# Patient Record
Sex: Male | Born: 1965 | Race: White | Hispanic: No | Marital: Married | Smoking: Never smoker
Health system: Southern US, Community
[De-identification: ages and names within clinical notes are randomized; demographics above are authoritative.]

## PROBLEM LIST (undated history)

## (undated) DIAGNOSIS — E785 Hyperlipidemia, unspecified: Secondary | ICD-10-CM

## (undated) DIAGNOSIS — R569 Unspecified convulsions: Secondary | ICD-10-CM

## (undated) DIAGNOSIS — G4730 Sleep apnea, unspecified: Secondary | ICD-10-CM

## (undated) DIAGNOSIS — D869 Sarcoidosis, unspecified: Secondary | ICD-10-CM

## (undated) DIAGNOSIS — I10 Essential (primary) hypertension: Secondary | ICD-10-CM

## (undated) DIAGNOSIS — F4310 Post-traumatic stress disorder, unspecified: Secondary | ICD-10-CM

## (undated) DIAGNOSIS — E119 Type 2 diabetes mellitus without complications: Secondary | ICD-10-CM

## (undated) DIAGNOSIS — F419 Anxiety disorder, unspecified: Secondary | ICD-10-CM

## (undated) DIAGNOSIS — N529 Male erectile dysfunction, unspecified: Secondary | ICD-10-CM

## (undated) DIAGNOSIS — J82 Pulmonary eosinophilia, not elsewhere classified: Secondary | ICD-10-CM

## (undated) DIAGNOSIS — F32A Depression, unspecified: Secondary | ICD-10-CM

## (undated) DIAGNOSIS — S83232A Complex tear of medial meniscus, current injury, left knee, initial encounter: Secondary | ICD-10-CM

## (undated) DIAGNOSIS — F329 Major depressive disorder, single episode, unspecified: Secondary | ICD-10-CM

## (undated) HISTORY — DX: Hyperlipidemia, unspecified: E78.5

## (undated) HISTORY — PX: KNEE SURGERY: SHX244

## (undated) HISTORY — DX: Unspecified convulsions: R56.9

## (undated) HISTORY — DX: Essential (primary) hypertension: I10

## (undated) HISTORY — PX: ELBOW SURGERY: SHX618

## (undated) HISTORY — DX: Pulmonary eosinophilia, not elsewhere classified: J82

---

## 1999-04-02 ENCOUNTER — Observation Stay (HOSPITAL_COMMUNITY): Admission: RE | Admit: 1999-04-02 | Discharge: 1999-04-03 | Payer: Self-pay | Admitting: Orthopedic Surgery

## 1999-12-25 ENCOUNTER — Inpatient Hospital Stay (HOSPITAL_COMMUNITY): Admission: RE | Admit: 1999-12-25 | Discharge: 1999-12-26 | Payer: Self-pay | Admitting: Orthopedic Surgery

## 2001-11-04 ENCOUNTER — Emergency Department (HOSPITAL_COMMUNITY): Admission: EM | Admit: 2001-11-04 | Discharge: 2001-11-04 | Payer: Self-pay | Admitting: Emergency Medicine

## 2002-07-19 ENCOUNTER — Encounter: Payer: Self-pay | Admitting: Orthopedic Surgery

## 2002-07-19 ENCOUNTER — Ambulatory Visit (HOSPITAL_COMMUNITY): Admission: RE | Admit: 2002-07-19 | Discharge: 2002-07-19 | Payer: Self-pay | Admitting: Orthopedic Surgery

## 2002-08-20 ENCOUNTER — Ambulatory Visit (HOSPITAL_BASED_OUTPATIENT_CLINIC_OR_DEPARTMENT_OTHER): Admission: RE | Admit: 2002-08-20 | Discharge: 2002-08-20 | Payer: Self-pay | Admitting: Orthopedic Surgery

## 2003-04-26 HISTORY — PX: COLON SURGERY: SHX602

## 2004-03-10 ENCOUNTER — Ambulatory Visit (HOSPITAL_COMMUNITY): Admission: RE | Admit: 2004-03-10 | Discharge: 2004-03-10 | Payer: Self-pay | Admitting: Family Medicine

## 2004-06-27 ENCOUNTER — Ambulatory Visit (HOSPITAL_BASED_OUTPATIENT_CLINIC_OR_DEPARTMENT_OTHER): Admission: RE | Admit: 2004-06-27 | Discharge: 2004-06-27 | Payer: Self-pay | Admitting: Family Medicine

## 2004-07-04 ENCOUNTER — Ambulatory Visit: Payer: Self-pay | Admitting: Internal Medicine

## 2004-08-23 ENCOUNTER — Encounter: Admission: RE | Admit: 2004-08-23 | Discharge: 2004-08-23 | Payer: Self-pay | Admitting: Dermatology

## 2004-08-27 ENCOUNTER — Ambulatory Visit (HOSPITAL_COMMUNITY): Admission: RE | Admit: 2004-08-27 | Discharge: 2004-08-27 | Payer: Self-pay | Admitting: Family Medicine

## 2004-12-22 ENCOUNTER — Ambulatory Visit (HOSPITAL_COMMUNITY): Admission: RE | Admit: 2004-12-22 | Discharge: 2004-12-22 | Payer: Self-pay | Admitting: Orthopedic Surgery

## 2005-04-22 ENCOUNTER — Encounter (INDEPENDENT_AMBULATORY_CARE_PROVIDER_SITE_OTHER): Payer: Self-pay | Admitting: *Deleted

## 2005-04-22 ENCOUNTER — Observation Stay (HOSPITAL_COMMUNITY): Admission: RE | Admit: 2005-04-22 | Discharge: 2005-04-23 | Payer: Self-pay | Admitting: Otolaryngology

## 2005-06-06 ENCOUNTER — Ambulatory Visit (HOSPITAL_COMMUNITY): Admission: RE | Admit: 2005-06-06 | Discharge: 2005-06-06 | Payer: Self-pay | Admitting: Neurological Surgery

## 2006-03-10 ENCOUNTER — Ambulatory Visit (HOSPITAL_BASED_OUTPATIENT_CLINIC_OR_DEPARTMENT_OTHER): Admission: RE | Admit: 2006-03-10 | Discharge: 2006-03-10 | Payer: Self-pay | Admitting: Urology

## 2006-05-22 ENCOUNTER — Encounter: Payer: Self-pay | Admitting: Vascular Surgery

## 2006-05-22 ENCOUNTER — Inpatient Hospital Stay (HOSPITAL_COMMUNITY): Admission: EM | Admit: 2006-05-22 | Discharge: 2006-05-27 | Payer: Self-pay | Admitting: Emergency Medicine

## 2006-06-12 ENCOUNTER — Ambulatory Visit: Payer: Self-pay | Admitting: Internal Medicine

## 2006-07-25 ENCOUNTER — Ambulatory Visit: Payer: Self-pay | Admitting: Internal Medicine

## 2006-11-14 ENCOUNTER — Ambulatory Visit: Payer: Self-pay | Admitting: Internal Medicine

## 2007-01-03 ENCOUNTER — Ambulatory Visit: Payer: Self-pay | Admitting: Internal Medicine

## 2007-01-03 LAB — CONVERTED CEMR LAB
ALT: 72 units/L — ABNORMAL HIGH (ref 0–53)
AST: 40 units/L — ABNORMAL HIGH (ref 0–37)
Albumin: 4.5 g/dL (ref 3.5–5.2)
Alkaline Phosphatase: 62 units/L (ref 39–117)
BUN: 9 mg/dL (ref 6–23)
CO2: 29 meq/L (ref 19–32)
Calcium: 9.3 mg/dL (ref 8.4–10.5)
Chloride: 102 meq/L (ref 96–112)
Creatinine, Ser: 1 mg/dL (ref 0.4–1.5)
GFR calc Af Amer: 105 mL/min
GFR calc non Af Amer: 87 mL/min
Glucose, Bld: 129 mg/dL — ABNORMAL HIGH (ref 70–99)
Potassium: 3.6 meq/L (ref 3.5–5.1)
Sed Rate: 6 mm/hr (ref 0–20)
Sodium: 138 meq/L (ref 135–145)
Total Bilirubin: 0.8 mg/dL (ref 0.3–1.2)
Total Protein: 6.8 g/dL (ref 6.0–8.3)

## 2007-03-07 DIAGNOSIS — D869 Sarcoidosis, unspecified: Secondary | ICD-10-CM

## 2007-03-07 DIAGNOSIS — M129 Arthropathy, unspecified: Secondary | ICD-10-CM | POA: Insufficient documentation

## 2007-03-07 DIAGNOSIS — L52 Erythema nodosum: Secondary | ICD-10-CM

## 2007-03-08 ENCOUNTER — Ambulatory Visit: Payer: Self-pay | Admitting: Internal Medicine

## 2007-05-25 ENCOUNTER — Ambulatory Visit (HOSPITAL_COMMUNITY): Admission: RE | Admit: 2007-05-25 | Discharge: 2007-05-25 | Payer: Self-pay | Admitting: Orthopedic Surgery

## 2007-06-08 ENCOUNTER — Encounter: Admission: RE | Admit: 2007-06-08 | Discharge: 2007-06-08 | Payer: Self-pay | Admitting: Orthopedic Surgery

## 2010-05-15 ENCOUNTER — Encounter: Payer: Self-pay | Admitting: Neurological Surgery

## 2010-05-15 ENCOUNTER — Encounter: Payer: Self-pay | Admitting: Family Medicine

## 2010-05-16 ENCOUNTER — Encounter: Payer: Self-pay | Admitting: Specialist

## 2010-05-17 ENCOUNTER — Encounter: Payer: Self-pay | Admitting: Family Medicine

## 2010-07-20 DIAGNOSIS — E785 Hyperlipidemia, unspecified: Secondary | ICD-10-CM

## 2010-07-20 DIAGNOSIS — F411 Generalized anxiety disorder: Secondary | ICD-10-CM

## 2010-07-20 DIAGNOSIS — E291 Testicular hypofunction: Secondary | ICD-10-CM

## 2010-07-20 DIAGNOSIS — I10 Essential (primary) hypertension: Secondary | ICD-10-CM

## 2010-08-31 ENCOUNTER — Ambulatory Visit (INDEPENDENT_AMBULATORY_CARE_PROVIDER_SITE_OTHER)
Admission: RE | Admit: 2010-08-31 | Discharge: 2010-08-31 | Disposition: A | Discharge status: Home / Self Care | Payer: BC Managed Care – PPO | Source: Ambulatory Visit | Attending: Internal Medicine | Admitting: Internal Medicine

## 2010-08-31 ENCOUNTER — Other Ambulatory Visit (INDEPENDENT_AMBULATORY_CARE_PROVIDER_SITE_OTHER): Payer: BC Managed Care – PPO

## 2010-08-31 ENCOUNTER — Encounter: Payer: Self-pay | Admitting: Internal Medicine

## 2010-08-31 ENCOUNTER — Ambulatory Visit (INDEPENDENT_AMBULATORY_CARE_PROVIDER_SITE_OTHER): Payer: BC Managed Care – PPO | Admitting: Internal Medicine

## 2010-08-31 VITALS — BP 120/90 | HR 72 | Temp 97.5°F | Ht 69.0 in | Wt 209.0 lb

## 2010-08-31 DIAGNOSIS — D869 Sarcoidosis, unspecified: Secondary | ICD-10-CM

## 2010-08-31 LAB — BASIC METABOLIC PANEL WITH GFR
BUN: 14 mg/dL (ref 6–23)
CO2: 29 meq/L (ref 19–32)
Calcium: 9.1 mg/dL (ref 8.4–10.5)
Chloride: 104 meq/L (ref 96–112)
Creatinine, Ser: 0.7 mg/dL (ref 0.4–1.5)
GFR: 124.57 mL/min
Glucose, Bld: 111 mg/dL — ABNORMAL HIGH (ref 70–99)
Potassium: 4.1 meq/L (ref 3.5–5.1)
Sodium: 140 meq/L (ref 135–145)

## 2010-08-31 LAB — CBC WITH DIFFERENTIAL/PLATELET
Basophils Absolute: 0 K/uL (ref 0.0–0.1)
Basophils Relative: 0.8 % (ref 0.0–3.0)
Eosinophils Absolute: 0.1 K/uL (ref 0.0–0.7)
Eosinophils Relative: 2.3 % (ref 0.0–5.0)
HCT: 45.3 % (ref 39.0–52.0)
Hemoglobin: 16.3 g/dL (ref 13.0–17.0)
Lymphocytes Relative: 21.5 % (ref 12.0–46.0)
Lymphs Abs: 1.2 K/uL (ref 0.7–4.0)
MCHC: 36 g/dL (ref 30.0–36.0)
MCV: 87.6 fl (ref 78.0–100.0)
Monocytes Absolute: 0.6 K/uL (ref 0.1–1.0)
Monocytes Relative: 10.8 % (ref 3.0–12.0)
Neutro Abs: 3.7 K/uL (ref 1.4–7.7)
Neutrophils Relative %: 64.6 % (ref 43.0–77.0)
Platelets: 162 K/uL (ref 150.0–400.0)
RBC: 5.17 Mil/uL (ref 4.22–5.81)
RDW: 14.5 % (ref 11.5–14.6)
WBC: 5.8 K/uL (ref 4.5–10.5)

## 2010-08-31 LAB — HEPATIC FUNCTION PANEL
ALT: 37 U/L (ref 0–53)
AST: 23 U/L (ref 0–37)
Albumin: 4.3 g/dL (ref 3.5–5.2)
Alkaline Phosphatase: 62 U/L (ref 39–117)
Bilirubin, Direct: 0.1 mg/dL (ref 0.0–0.3)
Total Bilirubin: 1.3 mg/dL — ABNORMAL HIGH (ref 0.3–1.2)
Total Protein: 6.9 g/dL (ref 6.0–8.3)

## 2010-08-31 LAB — SEDIMENTATION RATE: Sed Rate: 4 mm/h (ref 0–22)

## 2010-08-31 MED ORDER — HYDROXYCHLOROQUINE SULFATE 200 MG PO TABS: ORAL_TABLET | ORAL | Status: DC

## 2010-08-31 MED ORDER — PREDNISONE (PAK) 10 MG PO TABS: ORAL_TABLET | ORAL | Status: AC

## 2010-08-31 NOTE — Patient Instructions (Signed)
We will call you with xray and lab results  Take prednisone 10 mg 2 daily, then one daily x 2 weeks, then one half day  Plaquenil 200 mg one daily stop for nausea.  Please schedule a follow up office visit in 4 weeks, sooner if needed

## 2010-08-31 NOTE — Progress Notes (Signed)
  Subjective:    Patient ID: Tony Randolph, male    DOB: 03-18-1966, 45 y.o.   MRN: [id number]  HPI  85 yowm never smoker with acute onset arthritis, EN, hilar adenopathy na d pos skin bx at [facility] c/w sarcoid in 2007. Rx prednisone and complete resolution and off Prednisone as of July 2008 and fine until 05/2010 while [place] recurrent rash and joint pain and dry cough back on prednisone > complete resolution while on it but only for 2 weeks x 2.  08/31/2010 Initial pulmonary office eval in EMR era cc arthritis feet = in both heals, R hip not L hip or or knees better on naprosyn but concerned re easy bleeding from it.  New rash on face and in areas of tatoos "just like before".    Pt denies any significant sore throat, dysphagia, itching, sneezing,  nasal congestion or excess/ purulent secretions,  fever, chills, sweats, unintended wt loss, pleuritic or exertional cp, hempoptysis, orthopnea pnd or leg swelling.    Also denies any obvious fluctuation of symptoms with weather or environmental changes or other aggravating or alleviating factors.  No ocular or neuro complaints.  Minimal dry cough , no sob.  Review of Systems  Constitutional: Negative for fever, chills, activity change, appetite change and unexpected weight change.  HENT: Negative for congestion, sore throat, rhinorrhea, sneezing, trouble swallowing, dental problem, voice change and postnasal drip.   Eyes: Negative for visual disturbance.  Respiratory: Positive for cough. Negative for choking and shortness of breath.   Cardiovascular: Negative for chest pain and leg swelling.  Gastrointestinal: Negative for nausea, vomiting and abdominal pain.  Genitourinary: Negative for difficulty urinating.  Musculoskeletal: Positive for arthralgias.  Skin: Positive for rash.  Psychiatric/Behavioral: Negative for behavioral problems and confusion.       Objective:   Physical Exam    amb pleasant wm nad    Wt 209 08/31/10 HEENT: nl  dentition, turbinates, and orophanx. Nl external ear canals without cough reflex. Classic puruplish papular lesions x 3 R cheek.     NECK :  without JVD/Nodes/TM/ nl carotid upstrokes bilaterally   LUNGS: no acc muscle use, clear to A and P bilaterally without cough on insp or exp maneuvers   CV:  RRR  no s3 or murmur or increase in P2, no edema   ABD:  soft and nontender with nl excursion in the supine position. No bruits or organomegaly, bowel sounds nl  MS:  warm without deformities, calf tenderness, cyanosis or clubbing  SKIN: warm and dry without lesions . No viz lesions over ext/ no EN  NEURO:  alert, approp, no deficits     cxr 08/31/10 > Stable central peribronchial cuffing and unchanged mild right  basilar volume loss. No new focal or acute cardiopulmonary  abnormality.  Assessment & Plan:

## 2010-09-01 NOTE — Assessment & Plan Note (Signed)
Recurrent typical rash, somewhat atypical arthritis and no sign pulmonary involvement x for minimal dry cough with no def change on cxr.  Very unusual pattern but this is still most likely all sarcoid and should respond to plaquenil and minimal steroids this time.  The goal with a chronic steroid dependent illness is always arriving at the lowest effective dose that controls the disease/symptoms and not accepting a set "formula" which is based on statistics or guidelines that don't always take into account patient  variability or the natural hx of the dz in every individual patient, which may well vary over time.  For now therefore I recommend the patient maintain  For now set ceiling at 20 and floor at 5 mg per day  Discussed in detail all the  indications, usual  risks and alternatives  relative to the benefits with patient who agrees to proceed with rx as outlined.  See instructions for specific recommendations which were reviewed directly with the patient who was given a copy with highlighter outlining the key components.

## 2010-09-01 NOTE — Progress Notes (Signed)
Quick Note:  Spoke with pt and notified of results per Dr. Wert. Pt verbalized understanding and denied any questions.,  ______ 

## 2010-09-01 NOTE — Progress Notes (Signed)
Quick Note:  Spoke with pt and notified of results per Dr. Wert. Pt verbalized understanding and denied any questions.  ______ 

## 2010-09-07 NOTE — Assessment & Plan Note (Signed)
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  He was supposed to return in mid May for a  followup but did not do this.  He complains of mild weight gain (about  10 pounds), and borderline sugar per his nurse practitioner at [facility].  He denies any dyspnea or cough.   PHYSICAL EXAMINATION:  This is a pleasant, ambulatory, moderately obese  white male who has gained, according to my notes, only 3 pounds above  baseline.  He is afebrile with normal vital signs.  HEENT:  Unremarkable.  Oropharynx is clear.  Lung fields are clear bilaterally to auscultation and percussion.  HEART:  Regular rhythm without murmurs, gallops, or rubs.  ABDOMEN:  Soft, benign.  EXTREMITIES:  Warm without calf tenderness, cyanosis, clubbing, or  edema.  There is no evidence of cervical adenopathy.   Chest x-rays pending.   IMPRESSION:  No evidence of active sarcoid, nor any adverse effect from  prednisone including significant weight gain.   RECOMMENDATIONS:  Taper prednisone to 10 mg alternating with 5 mg per  day for 2 weeks, and then 5 mg per day for 2 weeks, and then 5 mg  alternating with zero for 2 weeks, and then return here for followup to  see if the prednisone can be stopped.   If he flares in terms of arthritis and it can be controlled with up to 3  or 4 Advil with meals, I would like him to use the Advil.  Otherwise he  needs to go up to the higher dose of prednisone, or if he flares with  erythema nodosum symptoms as he had before, I would like him to go to  the previous dose that worked, using the concept that the lowest dose  that controls the symptoms is the best dose.   I spent an extra 15-20 minutes at a 25 minute visit going over this  issue and the issue of adverse effects from prednisone with him,  including the importance of being very careful with diet, that he not  gain weight or take in food that is excessive sugars or starches.   I also emphasized that it is important to follow up at 6 weeks so we can  taper him completely off the prednisone if possible.   Chest x-rays pending at the time of this dictation and will be checked.     [name]. Sherene Sires, MD, [facility]  Electronically Signed    MBW/MedQ  DD: 11/14/2006  DT: 11/15/2006  Job #: [id number]

## 2010-09-07 NOTE — Assessment & Plan Note (Signed)
[facility]                             PULMONARY OFFICE NOTE   Tony Randolph, Tony Randolph                     MRN:          [id number]  DATE:01/03/2007                            DOB:          04-11-1966    This is a pulmonary extended followup office visit.   HISTORY:  A 45 year old white male with abrupt onset arthritis, erythema  nodosum and hilar adenopathy in December of 2007 with dramatic reversal  of symptoms with prednisone and elimination of prednisone in February of  2008.  He has had no significant flareup of any of his symptoms except  for mild arthritis for which he has found Advil not particularly  effective.  He says he is much better than he was.  He denies any ocular  or other new articular complaints, rash, cough, dyspnea, fevers, chills,  sweats or weight loss.   PHYSICAL EXAMINATION:  He is a pleasant ambulatory white man in no acute  distress.  He is afebrile with stable vital signs.  HEENT:  Is unremarkable.  Oropharynx clear.  LUNGS:  Lung fields clear bilaterally.  Clear to auscultation and  percussion  HEART:  Regular rate and rhythm.  Without murmur.  ABDOMEN:  Soft, benign.  EXTREMITIES:  Warm without calf tenderness, cyanosis, clubbing, edema.   Chest x-ray reviewed from July 22 and shows no significant evidence of  active sarcoid.   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  I have recommended a  trial of Aleve for this purpose.   I did recommend also a restaging of his systemic sarcoid with a  chemistry profile that will give [place] LFTs and calcium level and an ACE  level and sed rate to indicate any evidence of active systemic  inflammation or sarcoid.   I did also recommend Pneumovax therapy, which he  agreed to today.  Follow up will be in 3 months or sooner if needed with a recommendation  that he try Aleve in the meantime on an as needed basis.     [name]. Sherene Sires, MD, [facility]  Electronically Signed    MBW/MedQ  DD: 01/03/2007  DT: 01/04/2007  Job #: [id number]   cc:   Attention:  Ms Long, Nurse Practitioner [facility]

## 2010-09-07 NOTE — Assessment & Plan Note (Signed)
[facility]                             PULMONARY OFFICE NOTE   DUQUAN, GILLOOLY                     MRN:          [id number]  DATE:03/08/2007                            DOB:          1965/06/13    HISTORY:  This is a 45 year old white male with a classic, abrupt onset  of arthritis, erythema nodosum, hilar adenopathy with positive skin  biopsy for sarcoid in December of 2007.  He dramatically eliminated all  of his symptoms on prednisone acutely in February of 2008 and did not  require long-term therapy.  He did notice, however, significant myalgias  and arthralgias that occurred after prednisone was tapered (with no  other significant symptoms that could be attributed to sarcoid).  These  could all be controlled easily with the use of nonsteroidals except for  persistent left hip pain.  He says, in retrospect, he thinks he may have  injured his hip years ago and has never been right since.  His point  is that the prednisone totally eliminated what had been a chronic  problem with the left hip, but now the left hip is still hurting and  does not really respond well to nonsteroidals.   He denies any ocular or other articular complaints, fevers, chills,  sweats, orthopnea, PND or leg swelling or new rash.   PHYSICAL EXAMINATION:  He is a pleasant, ambulatory, moderately obese  white male in no acute distress weight 230 pounds, down 10 pounds from  his previous visit in September of this year.  HEENT:  Unremarkable.  Oropharynx is clear.  NECK:  Supple without cervical adenopathy or tenderness.  Trachea is  midline, no thyromegaly.  Lung fields are completely clear bilaterally to auscultation and  percussion.  HEART:  Regular rhythm without murmurs, gallops, or rubs.  ABDOMEN:  Soft, benign.  EXTREMITIES:  Warm without calf tenderness, cyanosis, clubbing, or  edema.  There was no evidence of any skin change consistent with sarcoid or  erythema nodosum for that matter.  Left hip revealed minimal reduction in internal and external rotation,  no radicular features elicited.   Most recent lab data done off of prednisone in September showed a sed  rate of only 6 with no elevation of total protein relative to albumin or  hypercalcemia or significant elevation of liver function tests.   IMPRESSION:  1. Classic Loeffler's syndrome (arthritis, erythema nodosum, and      adenopathy with only minimal residual arthritic complaints that are      well controlled with nonsteroidals, off of prednisone now since      July of this year with no evidence of definite flare up).  I did recommend a followup x-ray here in 3 months, but no further  systemic therapy other than nonsteroidals.  1. I am concerned about his left hip, but do not believe this is      sarcoid related, especially since, in retrospect, he had chronic      pain in the hip before he was ever started on prednisone.  The      response to prednisone, I  informed him, is nonspecific but      encouraging, and it may well be that just an injection of cortisone      might take care of the problem (such as might occur with bursitis).      I am, therefore, going to refer him to Dr. Chaney Malling to evaluate      his left hip, but recommended he continue on nonsteroidals only on      a p.r.n. basis.  Certainly if he has more convincing evidence of      systemic arthritis off of prednisone I would like to see him back      sooner.   I spent extra time going over the natural history of sarcoid with him,  emphasizing what is most likely and not likely to be sarcoid in his  particular case.     [name]. Sherene Sires, MD, [facility]  Electronically Signed    MBW/MedQ  DD: 03/08/2007  DT: 03/09/2007  Job #: [id number]

## 2010-09-10 NOTE — Procedures (Signed)
Tony Randolph, Tony Randolph NO.:  [id number]   MEDICAL RECORD NO.:  [id number]          PATIENT TYPE:  OUT   LOCATION:  SLEEP CENTER                 FACILITY:  [facility]   PHYSICIAN:  Clinton D. Maple Hudson, M.D. DATE OF BIRTH:  June 02, 1965   DATE OF STUDY:  06/27/2004                              NOCTURNAL POLYSOMNOGRAM   DATE OF STUDY:  June 27, 2004   REFERRING PHYSICIAN:  Dr. Johny Blamer   INDICATION FOR STUDY:  Hypersomnia with sleep apnea.  Epworth Sleepiness  Score 13/24, BMI 30.2, weight 200 pounds, neck size 18-1/2 inches.   SLEEP ARCHITECTURE:  Total sleep time 406 minutes with sleep efficiency 86%.  Stage I was 3%, stage II 69%, stages III and IV were absent, REM was 29% of  total sleep time.  Sleep latency 25 minutes, REM latency 208 minutes, awake  after sleep onset 41 minutes, arousal index 38.   RESPIRATORY DATA:  Split-study protocol.  Respiratory disturbance index  (RDI) was 90.5 obstructive events per hour indicating severe obstructive  sleep apnea/hypopnea syndrome.  This included 97 obstructive apneas and 96  hypopneas before CPAP.  Events were not positional.  REM RDI 4.6.  CPAP was  titrated to 12 CWP, RDI 7.9 per hour with snoring eliminated.  A medium  ResMed Ultra Mirage Mask was used with heated humidifier.   OXYGEN DATA:  Very loud snoring with oxygen desaturation to a nadir of 83%  before CPAP.  After CPAP control saturation held 94-96% on room air.   CARDIAC DATA:  Normal sinus rhythm and sinus bradycardia 54-57 per minute.   MOVEMENT/PARASOMNIA:  Occasional leg jerk.   IMPRESSION/RECOMMENDATION:  1.  Severe obstructive sleep apnea/hypopnea syndrome, respiratory      disturbance index 90.5 per hour with oxygen desaturation to 83%.  2.  Successful continuous positive airway pressure titration to 12 CWP,      respiratory disturbance index 7.9 per hour using a medium ResMed Ultra      Mirage Mask with heated humidifier.    CDY/MEDQ  D:   07/04/2004 08:12:14  T:  07/04/2004 18:46:30  Job:  [id number]

## 2010-09-10 NOTE — Assessment & Plan Note (Signed)
 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 His main pulmonary symptom  was dyspnea which is totally resolved as has his rash and arthritis on  20 mg of prednisone a day and he now comes back for followup all smiles.  He denies any excessive weight loss, myalgias, arthralgias, ocular  complaints, fevers, chills, sweats, cough, or dyspnea.   On physical examination, he is a pleasant ambulatory moderately obese  white male weighing 223 pounds which is actually down a pound from  previous visit. He is afebrile, stable vital signs.  HEENT: Unremarkable.  There is no cervical adenopathy or tenderness. Trachea is midline, no  thyromegaly.  LUNG FIELDS: Completely clear bilaterally to auscultation  and  percussion.  There is regular rate and rhythm without murmur, gallop, or rub, or  increase in P2.  ABDOMEN: Obese, otherwise benign.  EXTREMITIES: Warm without calf tenderness, cyanosis, clubbing, or edema.  No evidence or erythema nodosum.   PFTs were reviewed with the patient today and reveal only minimum  decrease in diffusing capacity at 73%, otherwise are normal.   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 This typically is an acute  presentation for sarcoid and also improves quickly without the need for  long term steroids.   I am therefore going to try him on 20 mg of prednisone one half daily  for 2 weeks and then one half every other day for 4 weeks and then  return for follow up chest x-ray at 6 weeks.     [name]. Sherene Sires, MD, [facility]  Electronically Signed    MBW/MedQ  DD: 07/26/2006  DT:  07/26/2006  Job #: [id number]   cc:   Janae Bridgeman. Eloise Harman., M.D.

## 2010-09-10 NOTE — Assessment & Plan Note (Signed)
[facility]                             PULMONARY OFFICE NOTE   Tony Randolph, Tony Randolph                     MRN:          [id number]  DATE:06/12/2006                            DOB:          Sep 03, 1965    REASON FOR PRESENTATION:  Possible sarcoid.   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 He was started on predinsone at 40 mg per day and is  almost 100% better at this point with no significant cough, but  continued mild dyspnea with exertion. He denies any ocular or articular,  fevers, chills, sweats, orthopnea, PND, or leg swelling.   PAST MEDICAL HISTORY:  Significant for hypertension, hyperlipidemia,  chronic headaches, sleep apnea, and remote sinus surgery.   ALLERGIES:  None known.   MEDICATIONS:  1. Prednisone at 40 mg daily.  2. Zoloft 100 mg q. day.  3. Gemfibrozil 1 daily.  4. Flomax 0.4 mg daily.  5. Prilosec 20 mg tablets 2 at q. a.m.   SOCIAL HISTORY:  He has never smoked, he works as an Retail banker,  denies any usual, travel, pet, or hobby exposure.   FAMILY HISTORY:  Significant for the absence of respiratory diseases,  atopy, sarcoid, or rheumatism.   REVIEW OF SYSTEMS:  Taken in detail on the worksheet negative except  outlined above.   PHYSICAL EXAMINATION:  This is a pleasant ambulatory white male in no  acute distress. He is afebrile, stable vital signs.  HEENT: Unremarkable, pharynx clear.  NECK: Supple without cervical adenopathy, tenderness, trachea is  midline, no thyromegaly.  LUNG FIELDS: Completely clear bilaterally to auscultation  and  percussion.  There is a regular rate  and rhythm without murmurs, gallops, or rub.  ABDOMEN: Soft, benign.  EXTREMITIES: Warm without calf tenderness, cyanosis, clubbing, or edema.   Chest x-ray dated January 30th, revealed prominent bilateral hilar  adenopathy with no significant interstitial disease. There was an old  compression fracture of the thoracal lumbar junction at the thoracal  lumbar junction.   IMPRESSION:  Classic acute onset sarcoid with a hilar adenopathy  associated with erythematosum and prominent arthritis that has responded  to prednisone at 40 mg daily. At this point I believe it would be  appropriate to try 20 mg per day and see him back in 4 to 6 weeks for  PFTs and then try to taper him to the lowest dose tolerable. It is  critical during this time that he be careful with dietary issues which I  reviewed with him (both in terms of treating reflux and also reducing  the side effects of steroids.)   I spent extra time discussing this issue with him as well as the  strategy of minimizing the amount of predinsone that it takes to  eliminate his symptoms because the disease will spontaneously resolve  within 3 years with or without specific treatment. In his particular  case, based on the acute nature of the illness, I believe the short term  prognosis is excellent for a complete remission with a very low or no  dose of steroids required chronically.     [name]. Sherene Sires, MD, [facility]  Electronically Signed    MBW/MedQ  DD: 06/12/2006  DT: 06/13/2006  Job #: [id number]

## 2010-09-10 NOTE — Consult Note (Signed)
NAMELEALAND, ELTING NO.:  [id number]   MEDICAL RECORD NO.:  [id number]          PATIENT TYPE:  INP   LOCATION:  5035                         FACILITY:  [facility]   PHYSICIAN:  Erasmo Leventhal, M.D.DATE OF BIRTH:  04-05-1966   DATE OF CONSULTATION:  05/24/2006  DATE OF DISCHARGE:                                 CONSULTATION   HISTORY OF PRESENT ILLNESS:  Mr. Swoyer is a 45 year old Caucasian male  who noted swelling about several of his tattoos a couple of weeks ago.  The PA at work sent him to a [facility].  At that point in time  these areas were biopsied, and pathology and lab reports were pending,  initially.  They thought it was probably an autoimmune process.  However, on 05/22/2006 he was in [facility] Room with a low-  grade temperature, progressive swelling in the legs and upper  extremities, and he was admitted for such.  I was consulted concerning  if there was evidence of a septic process about the right elbow/arm that  would require surgical intervention.  At this point in time the patient  stated that his legs felt okay, although these areas that are swollen  and red are tender or hurt not so bad.  On his left elbow/forearm he has  had some swelling, again tolerable, and the right arm is actually  feeling okay at this time.   For past medical history, medications, allergies, etc., please see  dictated admission history and physical.   PHYSICAL EXAMINATION:  VITAL SIGNS:  Afebrile at 98.4.  GENERAL:  Awake, alert, answered questions appropriately.  Laughing and  joking, in good spirits.  LOWER EXTREMITIES:  Bilateral lower extremities show symmetrical  subcutaneous areas of swelling and erythema, not fluctuant.  These are  more firm in nature.  These are about the shins primarily, and also  about the anterior thighs, and neither one itself is not warm to touch,  and no palpable effusions.  Full range of motion.  UPPER  EXTREMITIES:  Left upper extremity has a forearm nodule,  approximately 2 x 3 cm.  Again, slightly tender and nonerythematous.  The left elbow/forearm/elbow otherwise unremarkable.  Right upper  extremity:  He has erythema on the medial aspect of the arm.  The axilla  has no palpable nodes.  There is no streaking lymphangeitis.  It is  mainly warm to touch.  The elbow joint itself has no palpable effusion,  full range of motion actively and passively, without pain.  The  olecranon region is not erythematous.  There is a little bit of fluid in  his subcutaneous tissue, but this appears to be a  diffuse process, not  localized to bursa sac itself.   Plain x-rays of the elbow just show some soft tissue swelling.  Elbow  joint unremarkable.  Sed rate was normal, as was white count.  As I was  evaluating the patient, Dr. 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   IMPRESSION:  1. 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  2. I concur he does appear to have a cellulitis of the right arm.      There is no evidence of a septic arthritis, abscess, or septic      olecranon bursitis.  This is more of a soft-tissue process without      localized abscess or infectious process.   RECOMMENDATIONS:  At this point in time I would continue with  antibiotics, as you are currently doing.  Moist heat to the areas for  symptomatic relief, and I think this should do fine.  I have discussed  this with the patient and also with Dr. Lendell Caprice.  At this time I see no  surgical lesions.   Call for any further questions.           ______________________________  Erasmo Leventhal, M.D.     RAC/MEDQ  D:  05/23/2006  T:  05/24/2006  Job:  [id number]   cc:   Corinna L. Lendell Caprice, MD

## 2010-09-10 NOTE — Op Note (Signed)
NAMEJOHNAVON, MCCLAFFERTY NO.:  [id number]   MEDICAL RECORD NO.:  [id number]          PATIENT TYPE:  AMB   LOCATION:  NESC                         [facility]ACILITY:  [facility]   PHYSICIAN:  Valetta Fuller, M.D.  DATE OF BIRTH:  1966/01/25   DATE OF PROCEDURE:  03/10/2006  DATE OF DISCHARGE:                               OPERATIVE REPORT   PREOPERATIVE DIAGNOSES:  1. Bladder neck obstruction.  2. Meatal stenosis.   POSTOPERATIVE DIAGNOSES:  1. Bladder neck obstruction.  2. Meatal stenosis.   PROCEDURES PERFORMED:  1. Meatotomy.  2. Transurethral incision of prostate.   SURGEON:  Valetta Fuller, M.D.   ANESTHESIA:  General.   INDICATIONS:  Mr. Rabalais is a 45 year old male who has had some recent  progressive significant bladder neck obstructive symptoms.  The patient  has both considerable obstructive as well as irritative voiding symptoms  but mostly obstructive symptoms.  Clinical rectal exam revealed a small  prostate.  The patient did have formal video urodynamics, which showed a  reduced functional bladder capacity with hypersensitivity.  He had some  minimal instability, but the biggest issue was a very low flow rate with  high-pressure bladder.  His flow was only 5 mL/sec. with a detrusor  pressure between 50 and 60 cmH2O pressure.  He had responded to alpha  blockers, but they became less and less affective.  We talked with him  extensively about surgical options and thought that a transurethral  incision of his bladder neck/prostate would have a reasonably high  likelihood of improvement.  Retrograde ejaculation being the major  complication and since he had had a vasectomy, fertility was not a  concern.  He appeared to understand the advantages and disadvantages  approach and full informed consent was obtained.   TECHNIQUE AND FINDINGS:  The patient was brought to the operating room,  where he had successful induction of general anesthesia.  He was  prepped  and draped in the usual manner.  We evaluated his meatus and saw that  there was no way it could really accommodate the 24-French resectoscope  sheath.  For that reason I did a meatal dilation and then utilizing a  straight hemostat at the 6 o'clock position did a small meatotomy to  help assist that.  We then placed the 24-French resectoscope sheath in  his bladder.  His bladder showed some slight thickening with some  minimal trabecular change.  He had a very short prostatic urethra, only  measuring 3 cm, with minimal lateral lobe tissue but a very high-riding  and prominent median bar.  We used a Collings knife and made an incision  through the entire bladder neck and trigone from just distal to the  ureteral orifices in the midline through the bladder neck to the  verumontanum.  This resulted in marked opening of the bladder neck and  real elimination of any visual obstruction.  Hemostasis was excellent.  At the end of the procedure a 20-French Foley catheter with 30 mL  balloon was instilled.  Urine was clear.  The patient was brought to the  recovery room in stable condition.           ______________________________  Valetta Fuller, M.D.  Electronically Signed     DSG/MEDQ  D:  03/10/2006  T:  03/10/2006  Job:  [id number]

## 2010-09-10 NOTE — Op Note (Signed)
Tony Randolph, Tony Randolph              ACCOUNT NO.:  [id number]   MEDICAL RECORD NO.:  [id number]          PATIENT TYPE:  OBV   LOCATION:  2550                         FACILITY:  [facility]   PHYSICIAN:  Kinnie Scales. Annalee Genta, M.D.DATE OF BIRTH:  06/27/1965   DATE OF PROCEDURE:  04/22/2005  DATE OF DISCHARGE:  12/22/2004                                 OPERATIVE REPORT   PREOPERATIVE DIAGNOSES:  1.  Obstructive sleep apnea.  2.  Deviated nasal septum with nasal airway obstruction.  3.  Inferior turbinate enlargement.   POSTOPERATIVE DIAGNOSES:  1.  Obstructive sleep apnea.  2.  Deviated nasal septum with nasal airway obstruction.  3.  Inferior turbinate enlargement.   INDICATIONS FOR SURGERY:  1.  Obstructive sleep apnea.  2.  Deviated nasal septum with nasal airway obstruction.  3.  Inferior turbinate enlargement.   SURGICAL PROCEDURES:  1.  Uvulopalatopharyngoplasty.  2.  Nasal septoplasty.  3.  Bilateral inferior turbinate reduction.   ANESTHESIA:  General endotracheal.   SURGEON:  Kinnie Scales. Annalee Genta, M.D.   COMPLICATIONS:  None.   BLOOD LOSS:  Less than 100 mL.   The patient transferred from the operating room to the recovery room in  stable condition.   BRIEF HISTORY:  The patient is a 45 year old white male who was referred for  evaluation of heavy snoring and obstructive sleep apnea.  The patient had  undergone prior sleep study in March 2006, which showed an RDI of 90 events  per hour with an O2 nadir of 83%.  The patient was then titrated with nasal  CPAP but was unable to tolerate the device because of severe nasal airway  obstruction and congestion.  Although attempting to use CPAP for over three  months, the patient was unsuccessful in long-term use and was scheduled for  surgical intervention.  The risks, benefits and possible complications of  the above surgical procedures were discussed in detail with the patient and  his wife, and they understood and concurred  with our plan for surgery, which  was scheduled as above.   SURGICAL PROCEDURE:  The patient brought in the operating room at [facility] OR, placed in supine position on the operating table.  General  endotracheal anesthesia was established without difficulty.  When the  patient was adequately anesthetized, the nose was examined and injected with  a total of 8 mL of 1% lidocaine and 1:100,000 solution of epinephrine  injected in submucosal fashion on the nasal septum and inferior turbinates  bilaterally.  The patient's nose was then packed with Afrin-soaked cottonoid  pledgets that were left in place for approximately 10 minutes to allow for  vasoconstriction and hemostasis.  The patient was then positioned on the  operating table, prepped and draped in a sterile fashion, and the surgical  procedure was begun.   The procedure was begun with nasal septoplasty.  The nasal pack was removed  and the nasal cavity was examined.  The patient was found to have a severely  deviated nasal septum with greater than 90% nasal airway obstruction.  A  right anterior  hemitransfixion incision was created and a mucoperichondrial  flap was elevated from anterior to posterior on the patient's right-hand  side.  The bony-cartilaginous junction was crossed and a mucoperiosteal flap  was elevated along the patient's left-hand side.  The patient had severely  deviated nasal septal bone and cartilage.  Anterior, dorsal and columellar  cartilage was not deviated and was left intact.  Midseptal cartilage was  removed and at the conclusion of procedure was morcellized and returned the  mucoperichondrial pocket.  Mid and posterior bony septal deviation was  resected with a 4 mm osteotome, creating a midline nasal septum.  The  mucoperichondrial flaps were reapproximated over the morcellized cartilage  and closed with a 4-0 gut suture on a Keith needle in a horizontal  mattressing fashion.  Bilateral  Doyle nasal septal splints were placed after  the application of Bactroban ointment and sutured in position with a 3-0  Ethilon suture.   The inferior turbinate reduction was then performed with the cautery set at  12 watts.  Two to submucosal passes were made in each inferior turbinate  using bipolar intramural cautery.  When the turbinates were adequately  cauterized, they were outfractured and the anterior inferior aspect of the  turbinates was resected with a through-cutting forceps, preserving overlying  mucosa and resecting turbinate bone.   The patient's oral cavity and oropharynx were then examined and a  uvulopalatopharyngoplasty was performed.  The patient had undergone prior  tonsillectomy.  With a Crowe-Davis mouth gag inserted and the patient's oral  cavity and oropharynx thoroughly inspected, the UP3 was begun by creating an  inverted U-shaped anterior palatal incision, which was carried through the  mucosa and underlying submucosa.  A palatal flap was then developed  extending laterally into the tonsillar fossa and preserving the posterior  tonsillar pillars, removing mucosa and a small amount of lateral pharyngeal  musculature.  Dissection was carried out along the palate from superior to  inferior preserving posterior palatal mucosa and resecting approximately a 1  cm strip of mucosa, muscle and the entire uvula.  With tissue resected,  reconstruction then undertaken consisting of 4-0 Vicryl suture on a tapered  needle.  The posterior tonsillar pillars were advanced and tonsillar fossa  was closed.  Lateral pharyngeal musculature was then advanced and a  horizontal mattressing suture was used to approximate the lateral mucosa and  muscle anteriorly to the palate to create a more patent nasal pharyngeal  port.  The posterior palatal mucosa was then reflected anteriorly and closed  with interrupted 4-0 Vicryl suture.  The patient's nasal cavity, nasopharynx, oral cavity  and oropharynx were then thoroughly irrigated and  suctioned.  An oral gastric tube was passed and the stomach contents were  aspirated.  The patient was then awakened from his anesthetic.  He was  extubated was then transferred from the operating room to the recovery room  in stable condition.  There were no complications, and estimated blood loss  was less than 100 mL.           ______________________________  Kinnie Scales. Annalee Genta, M.D.     DLS/MEDQ  D:  04/54/0981  T:  04/22/2005  Job:  [id number]

## 2010-09-10 NOTE — Op Note (Signed)
NAMEARMANIE, Tony Randolph              ACCOUNT NO.:  [id number]   MEDICAL RECORD NO.:  [id number]          PATIENT TYPE:  AMB   LOCATION:  DAY                          FACILITY:  [facility]   PHYSICIAN:  Ronald A. Gioffre, M.D.DATE OF BIRTH:  04-23-1966   DATE OF PROCEDURE:  12/22/2004  DATE OF DISCHARGE:                                 OPERATIVE REPORT   SURGEON:  Georges Lynch. Darrelyn Hillock, M.D.   ASSISTANT:  Nurse.   PREOPERATIVE DIAGNOSIS:  Torn medial and lateral menisci of the right knee.   POSTOPERATIVE DIAGNOSIS:  1.  Torn medial and lateral menisci of the right knee.  2.  Partial tear of the lateral meniscus right knee.   OPERATION:  1.  Diagnostic arthroscopy right knee.  2.  Partial medial meniscectomy of the posterior horn of the right knee. He      had a bucket-handle type tear which was rather extensive.   DESCRIPTION OF PROCEDURE:  Under general anesthesia, routine orthopedic prep  and draping of the right lower extremity was carried out. The patient had 1  gram of IV Ancef. At this time, a small punctate incision was made in the  suprapatellar pouch, inflow cannula was inserted and the knee was distended  with saline. Another small punctate incision was made in the anterolateral  joint and at this time, the arthroscope was entered through the lateral  approach and complete diagnostic arthroscopy was carried out. The  suprapatellar pouch was normal, the cruciate's were intact, the lateral  meniscus showed a small peripheral tear of lateral meniscus. I introduced  the shaver suction device and did a partial lateral meniscectomy. The  arthroscope then was shifted to the medial joint and in the medial joint  space, I introduced a shaver suction device and did a medial meniscectomy to  posterior horn. Following this, after doing partial medial meniscectomy,  literally I had to take out almost the entire posterior horn, it was a  bucket-handle tear. I thoroughly irrigated out the knee,  removed the fluid,  closed all three punctate incisions with 3-0 nylon suture. I injected 20 mL  of 0.5% Marcaine with epinephrine in the knee joint. Following that, I  applied a sterile Neosporin bundle dressing.           ______________________________  Georges Lynch. Darrelyn Hillock, M.D.     RAG/MEDQ  D:  12/22/2004  T:  12/22/2004  Job:  [id number]

## 2010-09-10 NOTE — Discharge Summary (Signed)
Tony Randolph              ACCOUNT NO.:  [id number]   MEDICAL RECORD NO.:  [id number]          PATIENT TYPE:  INP   LOCATION:  5035                         FACILITY:  [facility]   PHYSICIAN:  Tony Randolph, M.D.DATE OF BIRTH:  October 21, 1965   DATE OF ADMISSION:  05/21/2006  DATE OF DISCHARGE:  05/27/2006                               DISCHARGE SUMMARY   DISCHARGE DIAGNOSES:  1. Acute sarcoidosis, question Lofgren syndrome per dermatology.  2. Superimposed cellulitis of right arm/elbow.  3. Erythema nodosa, secondary to #1.  4. Hypertension.  5. Hypercholesterolemia.  6. History of urethral stricture, status post treatment about 2 months      ago.   CONSULTATIONS:  1. Orthopedics:  Tony Randolph, M.D.  2. Dermatology:  Tony Randolph, M.D.  contact number [phone].   PROCEDURES AND STUDIES:  1. Doppler ultrasound:  No evidence of DVT or superficial thrombosis      in the right upper extremity.  2. X-rays of elbow:  Olecranon bursitis.  3. Chest x-ray:  Prominent hilar regionsm possible bronchopulmonary      adenopathy.   HISTORY:  The patient is a 45 year old pleasant white male who presented  with complaints of fevers.  He stated that until about 2 weeks prior to  admission he noted swelling underneath the ink on several of his  tattoos.  This progressed to most of his numerous tattoos over the  subsequent 2 weeks.  A few days prior to admission he saw a  dermatologist at [facility] and a biopsy of the areas of swelling was done,  and it was thought that it was likely an autoimmune process.  However,  the pathology was not yet available at the time that the patient  presented at [facility].  The patient also reported that in the 2  days prior to his admission he developed swelling of his elbows and  knees, which was somewhat painful.  He also began to develop a rash  elsewhere on his body, particularly his extremities.  In the 24 hours  prior to admission he  developed chills and on the night prior to  admission fevers, and he came to the ER at that point.  The patient  denied oral ulcers, also denied any changes in his vision as well as no  dysuria and no GI or respiratory complaints initially.  Later on he did  admit to some dyspnea intermittently.   PHYSICAL EXAMINATION:  VITAL SIGNS:  His physical exam upon admission as  per Tony Randolph revealed a temperature of 100.3 with a pulse of 105,  blood pressure of 139/87, respiratory rate of 18, O2 saturation of 96%.   The pertinent findings on his exam:  HEENT: Conjunctivae were slightly injected.  Mucous membranes were  moist.  There was no oral ulcers or thrush and no erythema.  NECK:  No adenopathy and no thyromegaly.  EXTREMITIES:  The patient was noted to have palpable joint effusions  over both elbows and both knees.  There was minimal limitation in his  passive range of motion.  There was no swelling noted  in any of his  other joints.  SKIN:  The patient has multiple tattoos and under several of his  tattoos, particularly in his right and left upper arms, he had swelling  underneath the ink with minimal surrounding erythema.  The swelling was  in the exact pattern of the tattoos and the underlying ink.  He also had  scattered areas of erythema on the upper extremities as well as his  abdomen.  He also had more significant erythema on his lower  extremities, particularly his shins; bilateral, symmetric and  considerable warmth and mild subcutaneous edema.   The rest of his physical exam was reported to be within normal limits.   LABORATORY DATA:  His white cell count 6.9 with a hemoglobin of 14.9,  hematocrit of 43.1, platelet count 189.  His sedimentation rate was 5.  His sodium was 136, potassium 3.5, chloride of 106, BUN 16, creatinine  0.9, glucose 122.  His pH was 7.46 venous, pCO2 35.   HOSPITAL COURSE:  Problem 1.  ACUTE/SYSTEMIC SARCOIDOSIS:  Upon admission the patient  had  lab work done including a sedimentation rate which was 5, acute  hepatitis profile was done and this was all negative, an HIV test was  also done and it was nonreactive.  Antinuclear antibodies were done,  negative, and his rheumatoid factor was less than 20.  The patient also  had a TSH done, which was within normal limits at 1.283.  orthopedics  was consulted regarding the patient's elbow x-ray findings of olecranon  and a concern about septic elbow given that the patient had had two  surgeries in that right elbow by Tony Randolph in the past.  Orthopedics  saw the patient, and the impression was that the patient had no evidence  of septic joint, abscess, or bursitis.  Per Tony Randolph, the elbow/arm  appeared to have cellulitis and there was nothing to aspirate.  Dr.  Lendell Randolph spoke with the physician assistant at [facility] and by  that time they had the results of the biopsy, and it showed noncaseating  granuloma consistent with sarcoidosis.  [facility] was consulted and Tony Randolph saw the patient, and his  impression was that the patient had acute sarcoidosis with a question of  Lofgren syndrome:  Arthralgias, uveitis,  erythema nodosum, pulmonary  and cutaneous involvement.  She recommended an ophthalmologic evaluation  to evaluate for uveitis, and the patient has been instructed to follow  up with an ophthalmologist upon discharge.  A chest x-ray was also done  to evaluate for hilar adenopathy, and the results are as stated above on  the patient is to follow up with [facility] upon discharge.  Tony Randolph also indicated that once it was clear that the patient did not  have a septic elbow that steroids should be started which would yield a  brisk resolution of his symptoms.  This was done once Orthopedics  indicated that the patient had no evidence of a septic joint.  The patient's symptoms indeed resolved briskly.  His right elbow  swelling is  completely resolved as well as the pain.  His dyspnea also resolved as  well as the erythema nodosa lesions in his lower extremities.  As  recommended per Dr. Donn Pierini Tony Randolph, the patient is to continue prednisone at  the same dose for about 2-3 months.  He was started on 40 mg.  He will  continue this dose for 2-3 months, at which time a  gradual taper over 1  year to of 10-20 mg every other day is recommended.  The patient is to  in the meantime follow up with a dermatologist after discharge for  potential chronic sarcoid manifestations.   Problem 2.  RIGHT UPPER EXTREMITY CELLULITIS:  The patient had blood  cultures done, which have grown no organisms.  He was empirically  started on broad-spectrum antibiotics.  With the cultures negative and  with improvement of his symptoms, the vancomycin was discontinued and  the patient was switched to oral antibiotics.  He has remained afebrile  with no leukocytosis and the swelling as well as erythema is resolved,  and the patient will be discharged to follow up with his primary care  physician.   Problem 3.  HYPERTENSION:  The patient was maintained on his outpatient  medications during his hospital stay.   Problem 4.  HYPERCHOLESTEROLEMIA:  The patient is to continue his  outpatient medications upon discharge.   DISCHARGE MEDICATIONS:  1. Prednisone 40 mg daily until follow-up with dermatology.  2. Augmentin 875 mg b.i.d., for 5 more days.  3. Prilosec 40 mg p.o. daily.  4. The patient to continue his preadmission medications.   FOLLOW-UP CARE:  1. Ophthalmologist.  Upon discharge.  Patient to call for appointment.  2. Dermatologist in 1-2 weeks.  Patient to call for appointment.  3. Pulmonologist, Dr. Sherene Randolph, upon discharge.  Patient to call for      appointment.  4. Primary care physician in 1 week.  Patient to call for appointment.   DISCHARGE CONDITION:  Improved/stable.      Tony Randolph, M.D.  Electronically  Signed     ACV/MEDQ  D:  05/27/2006  T:  05/27/2006  Job:  [id number]   cc:   Melida Quitter, M.D.  Tony Randolph, M.D.  [name]. Tony Sires, MD, FCCP

## 2010-09-10 NOTE — Op Note (Signed)
NAME:  Tony Randolph, Tony Randolph                        ACCOUNT NO.:  [id number]   MEDICAL RECORD NO.:  [id number]                   PATIENT TYPE:  AMB   LOCATION:  DSC                                  FACILITY:  [facility]   PHYSICIAN:  Georges Lynch. Darrelyn Hillock, M.D.             DATE OF BIRTH:  [birth year]   DATE OF PROCEDURE:  08/20/2002  DATE OF DISCHARGE:                                 OPERATIVE REPORT   SURGEON:  Georges Lynch. Darrelyn Hillock, M.D.   ASSISTANT:  Nurse.   PREOPERATIVE DIAGNOSES:  1. Chondral fracture of the lateral tibial plateau.  2. Partial tear of the medial meniscus, left knee.  3. 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   POSTOPERATIVE DIAGNOSES:  1. Chondral fracture of the lateral tibial plateau.  2. Partial tear of the medial meniscus, left knee.  3. 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   OPERATION:  1. Diagnostic arthroscopy of the left knee.  2. Partial medial meniscectomy, left knee.  3. Abrasion chondroplasty of the lateral tibial plateau, left knee.  4. Abrasion chondroplasty of the patella, left knee.   DESCRIPTION OF PROCEDURE:  Under general anesthesia, routine orthopedic prep  and draping of the left knee was carried out. The patient had 1 g of IV  Ancef preop. Note that a small punctate incision was made in the  suprapatellar pouch, inflow cannula was inserted, knee was distended with  saline. At this time, a small punctate incision was made in the  anterolateral joint space, a diagnostic arthroscopy then was carried out. 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 I introduced the shaver  suction device, did an abrasion chondroplasty of the patella, I also did a  synovectomy. I went down to the lateral joint space and did an abrasion  chondroplasty of the lateral tibial plateau. Following this, I went down  medially and did a partial medial meniscectomy, he had some  peripheral tear  at the medial meniscus.  I thoroughly irrigated out the knee, there were no  other abnormalities noted. I closed all three punctate incisions with 3-0  nylon suture. I injected 30 mL of 0.5% Marcaine with epinephrine into the  knee joint and a sterile Neosporin bundle dressing was applied. The patient  left the operating room in satisfactory condition.   POSTOPERATIVE CARE:  1. He will be crutches partial weight bearing.  2. He will be on Percocet 10/650 for pain.  3.     He will be on Keflex 500 mg by mouth q.i.d. for 4 days.  4. He will be on Bufferin, 1 twice a day for 2 weeks as an anticoagulant.  5. See him in the office in two weeks or prior to that if there is a     problem.  Ronald A. Darrelyn Hillock, M.D.    RAG/MEDQ  D:  08/20/2002  T:  08/20/2002  Job:  [id number]

## 2010-09-10 NOTE — H&P (Signed)
NAMEELIGHA, KMETZ NO.:  [id number]   MEDICAL RECORD NO.:  [id number]          PATIENT TYPE:  INP   LOCATION:  1830                         FACILITY:  [facility]   PHYSICIAN:  Andres Shad. Rudean Curt, MD     DATE OF BIRTH:  [birth year]   DATE OF ADMISSION:  05/22/2006  DATE OF DISCHARGE:                              HISTORY & PHYSICAL   CHIEF COMPLAINT:  Fever.   HISTORY OF PRESENT ILLNESS:  Tony Randolph is a 45 year old male who was  well until approximately 2 weeks ago when he noted swelling underneath  the ink on several of his tattoos. This progressed to most but all of  his numerous tattoos over the subsequent 2 weeks. Several days ago, he  was seen by a dermatologist at [facility] who biopsied one of these  areas of swelling and thought that this was likely an autoimmune process  however, the pathology is not yet available. Approximately 2 days ago,  he developed swelling of his elbows and knees which was somewhat  painful. He also began to develop rashes elsewhere on his body  particularly his extremities. Over the last 24-hours, he has developed  chills and then the night before admission he developed a temperature up  to 100.2. He was seen in the emergency department at that point.   REVIEW OF SYSTEMS:  Negative for oral ulcers, difficulties with vision,  dysuria, respiratory troubles or GI troubles. Review of systems is  positive for some abdominal pain.   PAST MEDICAL HISTORY:  High cholesterol, hypertension. The patient had a  urethral stricture that was treated approximately 2 months ago.   MEDICATIONS:  The patient is on a cholesterol medication and a  hypertension medication but he is unaware of their names or doses. He  has no known drug allergies.   FAMILY HISTORY:  Positive for his father with gout.   SOCIAL HISTORY:  No smoking, drugs or alcohol use.   PHYSICAL EXAMINATION:  VITAL SIGNS:  Temperature 100.3, heart rate 105,  blood pressure  139/87, respiratory rate 18, oxygen saturation 96% on  room air.  GENERAL:  The patient is alert, oriented in no apparent distress.  HEENT:  Conjunctivae were slightly injected. Mucous membranes were  moist. There were no oral ulcers, no thrush, no erythema.  NECK:  No lymphadenopathy, no thyromegaly.  CHEST:  Clear to auscultation bilaterally.  CARDIOVASCULAR:  Regular, normal S1 and S2. Physiologically split S2, no  murmurs, rubs or gallops.  ABDOMEN:  Normal bowel sounds, soft, nontender, nondistended, no  organomegaly.  EXTREMITIES:  The patient had palpable joint effusions over both elbows  and both knees. There is minimal limitation in passive range of motion.  There is no swelling in any other joint noted.  SKIN:  The patient has multiple tattoos. Under several of the tattoos  particularly his right and left upper arms, he had swelling beneath the  ink with minimal surrounding erythema. The swelling was in the exact  pattern of the tattoos and underlying the ink. He also had scattered  areas of erythema on his upper extremities as  well as his abdomen. He  also had more significant erythema on his lower extremities especially  the shins. He was bilaterally symmetrical here with considerable warmth  and mild subcutaneous edema.   LABORATORY DATA:  White blood cell count 6.9, hemoglobin 14.9,  hematocrit 43.1, platelet count 189. Polys 68%, lymphocytes 16%,  monocytes 13%, eosinophils 3%. Blood cultures were obtained. Leukocyte  sedimentation rate 5. Sodium 136, potassium 3.5, chloride 106, BUN 16,  creatinine 0.9, glucose 122, venous pH 7.46, venous pCO2 35.   ASSESSMENT/PLAN:  Febrile illness. This is a systemic illness with  symmetrical skin and joint findings. This is most likely an autoimmune  process that has not yet declared itself as a clear diagnosis.  Differential diagnoses would include adult __________  disease, systemic  lupus erythematosus and various vasculitides  including vasculitis from  viral hepatitis. I would also consider an infectious etiology, in  particular, subacute bacterial endocarditis or toxic shock syndrome  could present with fever and erythroderma.   PLAN:  1. Blood cultures have been obtained. I will admit the patient to the      hospital and begin empiric antibiotics, Vancomycin and Clindamycin      to cover for streptococci or staphylococci that could cause toxic      shock syndrome. I will send off additional tests including a      urinalysis, LFTs, lipase, ANA and hepatitis B&C serologies. Because      of the patient's recent onset of fever, I will treat him      aggressively with IV fluids without ensure that he does not develop      decompensated systemic inflammatory response syndrome. Rheumatology      will be consulted today. Additionally, we will need to contact [facility]      to find out if there is any preliminary pathology from this skin      biopsy.  2. Hypercholesterolemia and hypertension. We will need to obtain the      patient's home medications in order to treat him as an inpatient      with these drugs.      Andres Shad. Rudean Curt, MD  Electronically Signed     PML/MEDQ  D:  05/22/2006  T:  05/22/2006  Job:  [id number]   cc:   Melida Quitter, M.D.

## 2010-09-22 ENCOUNTER — Ambulatory Visit: Payer: BC Managed Care – PPO | Admitting: Internal Medicine

## 2010-10-14 ENCOUNTER — Ambulatory Visit (INDEPENDENT_AMBULATORY_CARE_PROVIDER_SITE_OTHER): Payer: BC Managed Care – PPO | Admitting: Internal Medicine

## 2010-10-14 ENCOUNTER — Encounter: Payer: Self-pay | Admitting: Internal Medicine

## 2010-10-14 VITALS — BP 128/80 | HR 79 | Temp 98.5°F | Ht 68.0 in | Wt 221.0 lb

## 2010-10-14 DIAGNOSIS — D869 Sarcoidosis, unspecified: Secondary | ICD-10-CM

## 2010-10-14 MED ORDER — HYDROXYCHLOROQUINE SULFATE 200 MG PO TABS: 200.0000 mg | ORAL_TABLET | Freq: Two times a day (BID) | ORAL | Status: DC

## 2010-10-14 MED ORDER — PREDNISONE 5 MG PO TABS: ORAL_TABLET | ORAL | Status: DC

## 2010-10-14 NOTE — Patient Instructions (Signed)
Plaquenil 200mg  one twice daily should minimize the need for prednisone  Please schedule a follow up visit in 3 months but call sooner if needed

## 2010-10-14 NOTE — Progress Notes (Signed)
  Subjective:    Patient ID: Tony Randolph, male    DOB: [birth year], 45 y.o.   MRN: [id number]  HPI  43 yowm never smoker with acute onset arthritis, EN, hilar adenopathy na d pos skin bx at [facility] c/w sarcoid in 2007. Rx prednisone and complete resolution and off Prednisone as of July 2008 and fine until 05/2010 while [place] recurrent rash and joint pain and dry cough back on prednisone > complete resolution while on it but only for 2 weeks x 2.  08/31/2010 Initial pulmonary office eval in EMR era cc arthritis feet = in both heals, R hip not L hip or or knees better on naprosyn but concerned re easy bleeding from it.  New rash on face and in areas of tatoos "just like before".  rec We will call you with xray and lab results  Take prednisone 10 mg 2 daily, then one daily x 2 weeks, then one half day  Plaquenil 200 mg one daily stop for nausea.   10/14/2010 ov/Wert stopped prednisone completely then resumed 6/17 due to rash and aches arms, no face involvment No cough or sob, ocular or articular co's x R elbow stiffness/pain  Pt denies any significant sore throat, dysphagia, itching, sneezing,  nasal congestion or excess/ purulent secretions,  fever, chills, sweats, unintended wt loss, pleuritic or exertional cp, hempoptysis, orthopnea pnd or leg swelling.    Also denies any obvious fluctuation of symptoms with weather or environmental changes or other aggravating or alleviating factors.           Objective:   Physical Exam    amb pleasant wm nad      Wt 209 08/31/10  > 221 10/14/2010   HEENT: nl dentition, turbinates, and orophanx. Nl external ear canals without cough reflex. Classic puruplish papular lesions x 3 R cheek  Have resolved   NECK :  without JVD/Nodes/TM/ nl carotid upstrokes bilaterally   LUNGS: no acc muscle use, clear to A and P bilaterally without cough on insp or exp maneuvers   CV:  RRR  no s3 or murmur or increase in P2, no edema   ABD:  soft and nontender  with nl excursion in the supine position. No bruits or organomegaly, bowel sounds nl  MS:  warm without deformities, calf tenderness, cyanosis or clubbing  Skin:  No active lesions/ extensive tatoos       cxr 08/31/10 > Stable central peribronchial cuffing and unchanged mild right  basilar volume loss. No new focal or acute cardiopulmonary  abnormality.  Assessment & Plan:

## 2010-10-15 ENCOUNTER — Encounter: Payer: Self-pay | Admitting: Internal Medicine

## 2010-10-15 NOTE — Assessment & Plan Note (Addendum)
No classic flare (face free of rash) but he's convinced the symptoms on his arms are similar to sarcoid flare previously bx proven so ok to try 400 mg per day of plaquenil given lft's ok on 200 per day  Discussed in detail all the  indications, usual  risks and alternatives  relative to the benefits with patient who agrees to proceed with plaquenil in higher doses.

## 2010-10-18 ENCOUNTER — Telehealth: Payer: Self-pay | Admitting: *Deleted

## 2010-10-18 NOTE — Telephone Encounter (Signed)
Pt failed to go down for labs last ov- needs to have these done asap. LMTCB x 2.

## 2010-10-22 NOTE — Telephone Encounter (Signed)
Spoke with pt's spouse. She states that pt was unable to come and get labs this wk, but states "I will make him come next wk on Monday". Will hold msg here until he comes in.

## 2010-10-29 ENCOUNTER — Other Ambulatory Visit (INDEPENDENT_AMBULATORY_CARE_PROVIDER_SITE_OTHER): Payer: BC Managed Care – PPO

## 2010-10-29 DIAGNOSIS — D869 Sarcoidosis, unspecified: Secondary | ICD-10-CM

## 2010-10-29 LAB — CBC WITH DIFFERENTIAL/PLATELET
Basophils Absolute: 0 10*3/uL (ref 0.0–0.1)
Basophils Relative: 0.7 % (ref 0.0–3.0)
Eosinophils Absolute: 0.1 10*3/uL (ref 0.0–0.7)
Lymphocytes Relative: 19.3 % (ref 12.0–46.0)
MCHC: 35.9 g/dL (ref 30.0–36.0)
MCV: 88.6 fl (ref 78.0–100.0)
Monocytes Absolute: 0.5 10*3/uL (ref 0.1–1.0)
Neutrophils Relative %: 68.4 % (ref 43.0–77.0)
Platelets: 141 10*3/uL — ABNORMAL LOW (ref 150.0–400.0)
RDW: 13.6 % (ref 11.5–14.6)

## 2010-10-29 LAB — HEPATIC FUNCTION PANEL
ALT: 37 U/L (ref 0–53)
AST: 20 U/L (ref 0–37)
Albumin: 4.6 g/dL (ref 3.5–5.2)
Alkaline Phosphatase: 67 U/L (ref 39–117)
Bilirubin, Direct: 0.1 mg/dL (ref 0.0–0.3)
Total Protein: 6.9 g/dL (ref 6.0–8.3)

## 2010-11-01 NOTE — Progress Notes (Signed)
Quick Note:  Spoke with pt and notified of results per Dr. Wert. Pt verbalized understanding and denied any questions.  ______ 

## 2011-04-21 ENCOUNTER — Other Ambulatory Visit: Payer: Self-pay | Admitting: Internal Medicine

## 2011-06-28 ENCOUNTER — Other Ambulatory Visit: Payer: Self-pay | Admitting: Internal Medicine

## 2011-07-01 ENCOUNTER — Encounter: Payer: BC Managed Care – PPO | Admitting: Internal Medicine

## 2011-07-01 NOTE — Progress Notes (Signed)
 This encounter was created in error - please disregard.

## 2011-07-11 ENCOUNTER — Ambulatory Visit (INDEPENDENT_AMBULATORY_CARE_PROVIDER_SITE_OTHER): Payer: BC Managed Care – PPO | Admitting: Internal Medicine

## 2011-07-11 ENCOUNTER — Encounter: Payer: Self-pay | Admitting: Internal Medicine

## 2011-07-11 VITALS — BP 136/82 | HR 83 | Temp 98.0°F | Ht 69.0 in | Wt 222.0 lb

## 2011-07-11 DIAGNOSIS — Z23 Encounter for immunization: Secondary | ICD-10-CM

## 2011-07-11 DIAGNOSIS — D869 Sarcoidosis, unspecified: Secondary | ICD-10-CM

## 2011-07-11 MED ORDER — HYDROXYCHLOROQUINE SULFATE 200 MG PO TABS: 200.0000 mg | ORAL_TABLET | Freq: Two times a day (BID) | ORAL | Status: DC

## 2011-07-11 NOTE — Patient Instructions (Signed)
Plaquenil 200 2 daily  Please schedule a follow up visit in 3 months but call sooner if needed

## 2011-07-11 NOTE — Progress Notes (Signed)
  Subjective:    Patient ID: Tony Randolph, male    DOB: 1965-07-05 .   MRN: [id number]   Brief patient profile:  53 yowm never smoker with acute onset arthritis, EN, hilar adenopathy na d pos skin bx at [facility] c/w sarcoid in 2007. Rx prednisone and complete resolution and off Prednisone as of July 2008 and fine until 05/2010 while [place] recurrent rash and joint pain and dry cough back on prednisone > complete resolution while on it but only for 2 weeks then relapsed.   HPI 08/31/2010 Initial pulmonary office eval in EMR era cc arthritis feet = in both heals, R hip not L hip or or knees better on naprosyn but concerned re easy bleeding from it.  New rash on face and in areas of tatoos "just like before".  rec We will call you with xray and lab results Take prednisone 10 mg 2 daily, then one daily x 2 weeks, then one half day Plaquenil 200 mg one daily stop for nausea.   10/14/2010 ov/Deshea Pooley stopped prednisone completely then resumed 6/17 due to rash and aches arms, no face involvment No cough or sob, ocular or articular co's x R elbow stiffness/pain rec  Plaquenil 400 mg > 100% resolution with LFT's ok 10/2010  07/11/2011 f/u ov/Deisy Ozbun ran out of plaquenil x 1 month then rash x 2 weeks prior to OV   with minimal arthitis no sob or cough or ocular co's  Pt denies any significant sore throat, dysphagia, itching, sneezing,  nasal congestion or excess/ purulent secretions,  fever, chills, sweats, unintended wt loss, pleuritic or exertional cp, hempoptysis, orthopnea pnd or leg swelling.    Also denies any obvious fluctuation of symptoms with weather or environmental changes or other aggravating or alleviating factors.           Objective:   Physical Exam    amb pleasant wm nad      Wt 209 08/31/10  > 221 10/14/2010 > 222 07/11/2011   HEENT: nl dentition, turbinates, and orophanx. Nl external ear canals without cough reflex. No facial rash  NECK :  without JVD/Nodes/TM/ nl carotid upstrokes  bilaterally   LUNGS: no acc muscle use, clear to A and P bilaterally without cough on insp or exp maneuvers   CV:  RRR  no s3 or murmur or increase in P2, no edema   ABD:  soft and nontender with nl excursion in the supine position. No bruits or organomegaly, bowel sounds nl  MS:  warm without deformities, calf tenderness, cyanosis or clubbing  Skin:  extensive tatoos with one area L forearm "his typical  sarcoid changes" with maculopapular puntate slt erythematos lesions, multiple, with some tendency to coalescence        cxr 08/31/10 > Stable central peribronchial cuffing and unchanged mild right  basilar volume loss. No new focal or acute cardiopulmonary  abnormality.  Assessment & Plan:

## 2011-07-12 NOTE — Assessment & Plan Note (Signed)
-   Dx 2007 [facility]      - Flare 05/2010 with rash > try plaquenil  08/31/10 and short course pred       - Mild flare on plaquenil 200 so increase to 400 mg per day 10/15/2010   Flared again with skin involvement w/in two weeks of stopping plaquenil which he tolerated fine so rec restart but f/u here in 3 months for complete "restaging"

## 2011-10-10 ENCOUNTER — Encounter: Payer: BC Managed Care – PPO | Admitting: Internal Medicine

## 2011-10-10 NOTE — Progress Notes (Signed)
 This encounter was created in error - please disregard.

## 2011-10-18 ENCOUNTER — Ambulatory Visit (INDEPENDENT_AMBULATORY_CARE_PROVIDER_SITE_OTHER)
Admission: RE | Admit: 2011-10-18 | Discharge: 2011-10-18 | Disposition: A | Discharge status: Home / Self Care | Payer: BC Managed Care – PPO | Source: Ambulatory Visit | Attending: Adult Health | Admitting: Adult Health

## 2011-10-18 ENCOUNTER — Encounter: Payer: Self-pay | Admitting: Adult Health

## 2011-10-18 ENCOUNTER — Ambulatory Visit (INDEPENDENT_AMBULATORY_CARE_PROVIDER_SITE_OTHER): Payer: BC Managed Care – PPO | Admitting: Adult Health

## 2011-10-18 ENCOUNTER — Other Ambulatory Visit (INDEPENDENT_AMBULATORY_CARE_PROVIDER_SITE_OTHER): Payer: BC Managed Care – PPO

## 2011-10-18 VITALS — BP 130/78 | HR 80 | Temp 97.3°F | Ht 69.0 in | Wt 228.8 lb

## 2011-10-18 DIAGNOSIS — D869 Sarcoidosis, unspecified: Secondary | ICD-10-CM

## 2011-10-18 LAB — HEPATIC FUNCTION PANEL
AST: 27 U/L (ref 0–37)
Alkaline Phosphatase: 64 U/L (ref 39–117)
Bilirubin, Direct: 0.1 mg/dL (ref 0.0–0.3)
Total Protein: 6.9 g/dL (ref 6.0–8.3)

## 2011-10-18 LAB — BASIC METABOLIC PANEL
BUN: 16 mg/dL (ref 6–23)
CO2: 29 mEq/L (ref 19–32)
Calcium: 9.1 mg/dL (ref 8.4–10.5)
Chloride: 105 mEq/L (ref 96–112)
Creatinine, Ser: 0.8 mg/dL (ref 0.4–1.5)

## 2011-10-18 LAB — CBC WITH DIFFERENTIAL/PLATELET
Basophils Relative: 0.7 % (ref 0.0–3.0)
Eosinophils Absolute: 0.2 10*3/uL (ref 0.0–0.7)
Lymphocytes Relative: 22.1 % (ref 12.0–46.0)
MCHC: 34.2 g/dL (ref 30.0–36.0)
Monocytes Relative: 11.2 % (ref 3.0–12.0)
Neutrophils Relative %: 63.1 % (ref 43.0–77.0)
RBC: 5.1 Mil/uL (ref 4.22–5.81)
WBC: 5.7 10*3/uL (ref 4.5–10.5)

## 2011-10-18 MED ORDER — PREDNISONE 10 MG PO TABS: ORAL_TABLET | ORAL | Status: DC

## 2011-10-18 MED ORDER — HYDROXYCHLOROQUINE SULFATE 200 MG PO TABS: 200.0000 mg | ORAL_TABLET | Freq: Two times a day (BID) | ORAL | Status: DC

## 2011-10-18 NOTE — Progress Notes (Signed)
Subjective:    Patient ID: Tony Randolph, male    DOB: April 11, 1966 .   MRN: [id number] Brief patient profile:  73 yowm never smoker with acute onset arthritis, EN, hilar adenopathy na d pos skin bx at [facility] c/w sarcoid in 2007. Rx prednisone and complete resolution and off Prednisone as of July 2008 and fine until 05/2010 while [place] recurrent rash and joint pain and dry cough back on prednisone > complete resolution while on it but only for 2 weeks then relapsed.   HPI 08/31/2010 Initial pulmonary office eval in EMR era cc arthritis feet = in both heals, R hip not L hip or or knees better on naprosyn but concerned re easy bleeding from it.  New rash on face and in areas of tatoos "just like before".  rec We will call you with xray and lab results Take prednisone 10 mg 2 daily, then one daily x 2 weeks, then one half day Plaquenil 200 mg one daily stop for nausea.   10/14/2010 ov/Wert stopped prednisone completely then resumed 6/17 due to rash and aches arms, no face involvment No cough or sob, ocular or articular co's x R elbow stiffness/pain rec  Plaquenil 400 mg > 100% resolution with LFT's ok 10/2010  07/11/2011  f/u ov/Wert ran out of plaquenil x 1 month then rash x 2 weeks prior to OV   with minimal arthitis no sob or cough or ocular co's rec Plaquenil 200 2 daily  10/18/2011 Follow up  Returns for follow up of Sarcoidosis. Doing about the same.  Works in [employer] account planner. Seems to have flares while travel to [place]  Has had to use Prednisone x 1 since last ov due to rash and arthralgia  No fever chest pain or n/v/d.    ROS:  Constitutional:   No  weight loss, night sweats,  Fevers, chills, + fatigue, or  lassitude.  HEENT:   No headaches,  Difficulty swallowing,  Tooth/dental problems, or  Sore throat,                No sneezing, itching, ear ache, nasal congestion, post nasal drip,   CV:  No chest pain,  Orthopnea, PND, swelling in lower extremities, anasarca,  dizziness, palpitations, syncope.   GI  No heartburn, indigestion, abdominal pain, nausea, vomiting, diarrhea, change in bowel habits, loss of appetite, bloody stools.   Resp: No shortness of breath with exertion or at rest.  No excess mucus, no productive cough,  No non-productive cough,  No coughing up of blood.  No change in color of mucus.  No wheezing.  No chest wall deformity  Skin: no rash or lesions.  GU: no dysuria, change in color of urine, no urgency or frequency.  No flank pain, no hematuria   MS:  No joint pain or swelling.  No decreased range of motion.  No back pain.  Psych:  No change in mood or affect. No depression or anxiety.  No memory loss.               Objective:   Physical Exam    amb pleasant wm nad      Wt 209 08/31/10  > 221 10/14/2010 > 222 07/11/2011 > 10/10/2011 >>228 10/18/2011   HEENT: nl dentition, turbinates, and orophanx. Nl external ear canals without cough reflex. No facial rash  NECK :  without JVD/Nodes/TM/ nl carotid upstrokes bilaterally   LUNGS: no acc muscle use, clear to A and P bilaterally without  cough on insp or exp maneuvers   CV:  RRR  no s3 or murmur or increase in P2, no edema   ABD:  soft and nontender with nl excursion in the supine position. No bruits or organomegaly, bowel sounds nl  MS:  warm without deformities, calf tenderness, cyanosis or clubbing  Skin:  extensive tatoos           Assessment & Plan:

## 2011-10-18 NOTE — Assessment & Plan Note (Addendum)
Compensated on present regimen.  Check labs w/ cbc, bmet and lft  Check xray  Cont on current regimen.

## 2011-10-18 NOTE — Addendum Note (Signed)
Addended by: Julio Sicks on: 10/18/2011 02:54 PM   Modules accepted: Orders

## 2011-10-18 NOTE — Patient Instructions (Signed)
Continue on current regimen.  I will call with lab and xray results.  Get yearly eye exams.  follow up Dr. Sherene Sires  In 4 -6 months and As needed

## 2011-10-18 NOTE — Addendum Note (Signed)
Addended by: Fenton Foy on: 10/18/2011 02:57 PM   Modules accepted: Orders

## 2012-01-10 ENCOUNTER — Encounter: Payer: BC Managed Care – PPO | Admitting: Internal Medicine

## 2012-01-10 NOTE — Progress Notes (Signed)
 This encounter was created in error - please disregard.

## 2012-01-27 ENCOUNTER — Encounter: Payer: BC Managed Care – PPO | Admitting: Internal Medicine

## 2012-01-27 NOTE — Progress Notes (Signed)
 This encounter was created in error - please disregard.

## 2012-02-06 ENCOUNTER — Encounter: Payer: BC Managed Care – PPO | Admitting: Internal Medicine

## 2012-02-06 NOTE — Progress Notes (Signed)
 This encounter was created in error - please disregard.

## 2012-03-04 IMAGING — CR DG CHEST 2V
2 series · 2 of 2 positions shown · non-contrast
Comparison: 11/14/2006

CLINICAL DATA: Sarcoidosis.  No complaints.  Nonsmoker

CHEST - 2 VIEW

[view not recorded (1 of 2)]
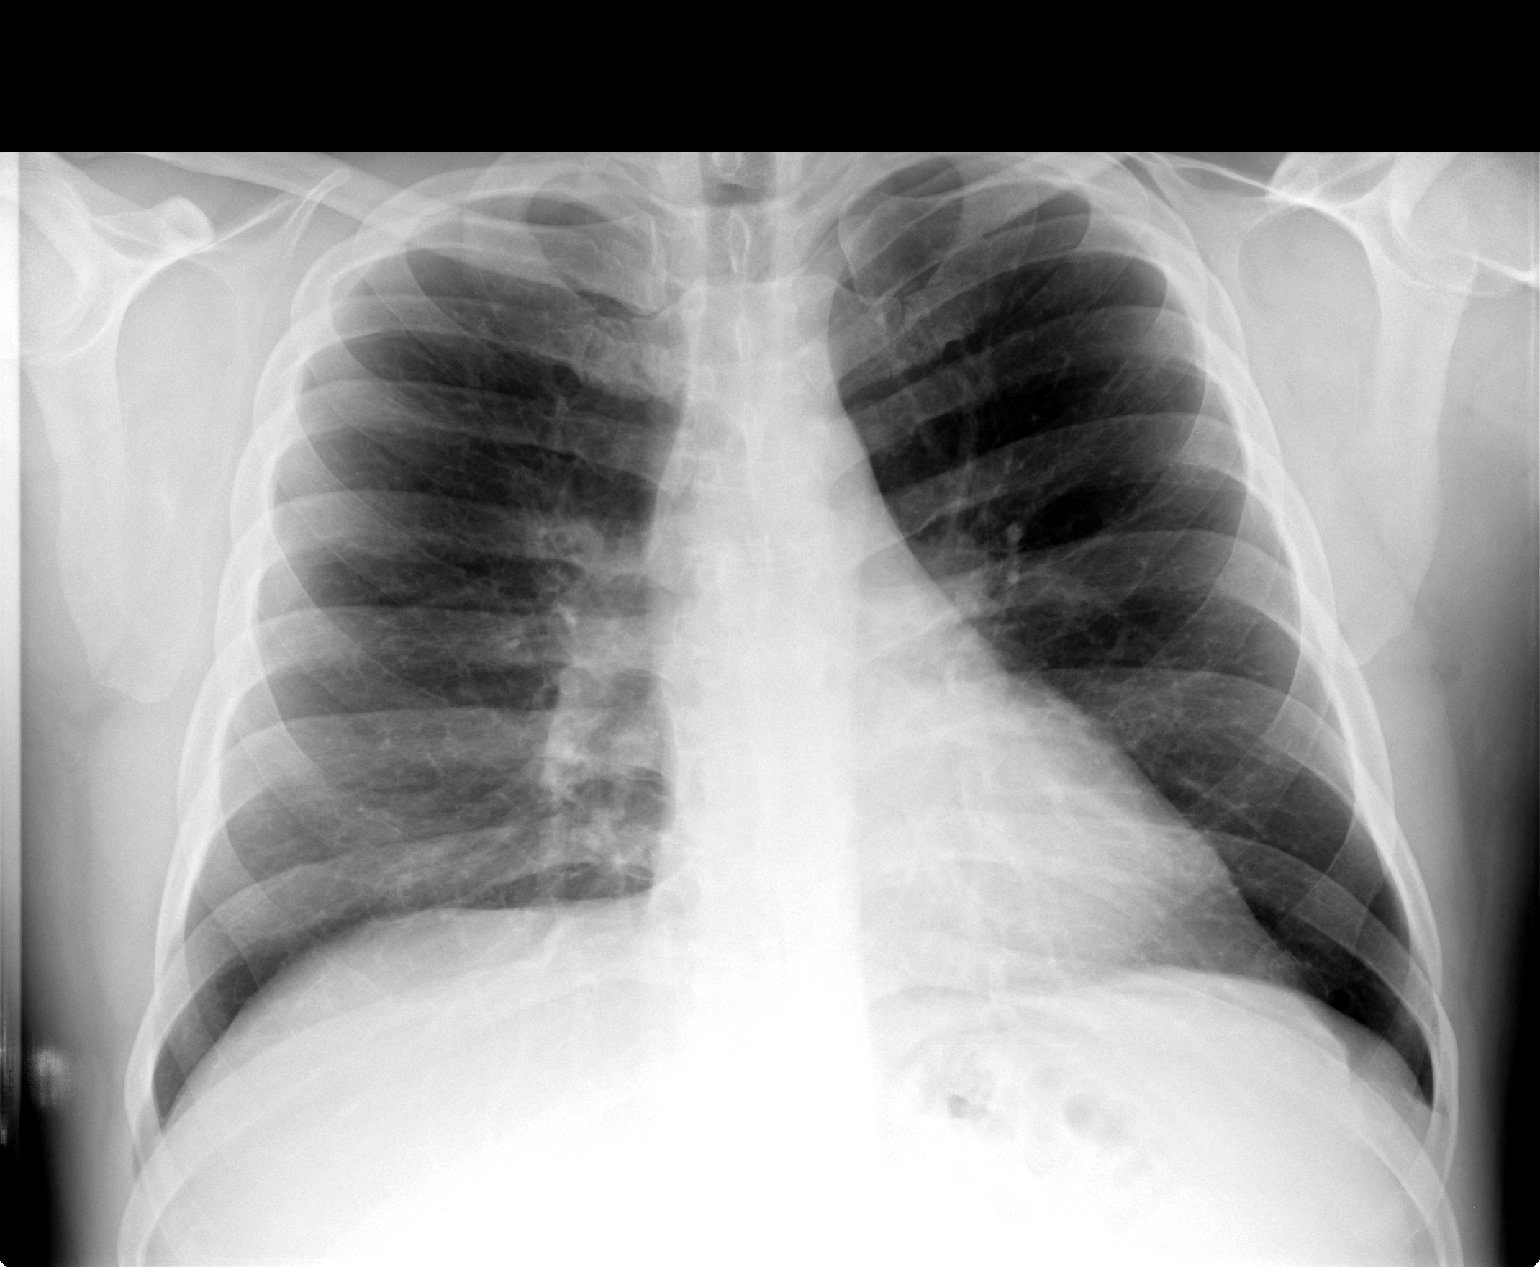

[view not recorded (2 of 2)]
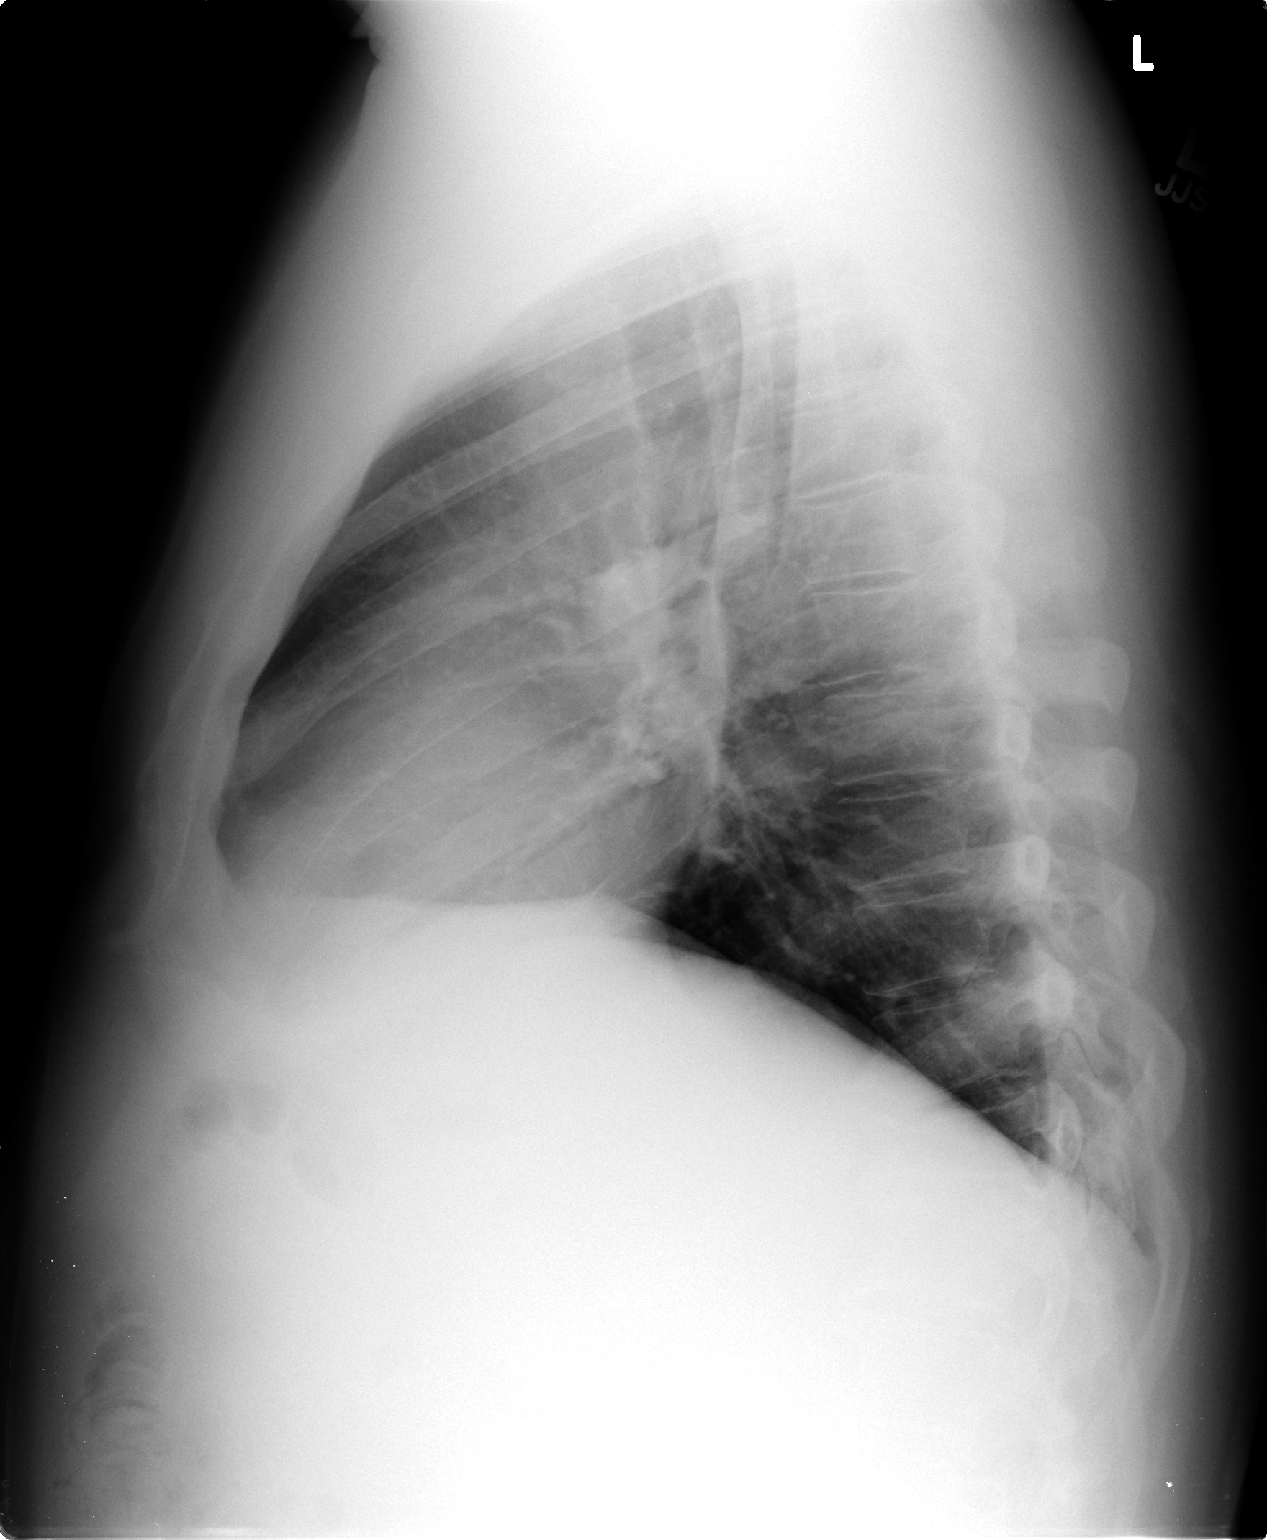

[2 of 2 positions shown; findings below may reference images not displayed]

FINDINGS: Heart/mediastinal contours are stable and within normal
limits.  No signs of hilar mediastinal adenopathy are noted.

The lung fields demonstrate some mild central peribronchial cuffing
which is stable.  Mild right basilar atelectasis is identified and
is unchanged in comparison with the prior exam.  No new focal
infiltrates or signs of congestive failure are noted.  No
significant interstitial prominence is identified.

Bony structures demonstrate unchanged compression of the T11
vertebral body.
IMPRESSION: Stable central peribronchial cuffing and unchanged mild right
basilar volume loss.  No new focal or acute cardiopulmonary
abnormality.

## 2012-10-08 ENCOUNTER — Ambulatory Visit (INDEPENDENT_AMBULATORY_CARE_PROVIDER_SITE_OTHER): Payer: 59 | Admitting: Nurse Practitioner

## 2012-10-08 ENCOUNTER — Encounter: Payer: Self-pay | Admitting: Nurse Practitioner

## 2012-10-08 VITALS — BP 140/87 | HR 76 | Temp 98.1°F | Ht 69.0 in | Wt 219.0 lb

## 2012-10-08 DIAGNOSIS — Z0000 Encounter for general adult medical examination without abnormal findings: Secondary | ICD-10-CM

## 2012-10-08 DIAGNOSIS — R7309 Other abnormal glucose: Secondary | ICD-10-CM

## 2012-10-08 DIAGNOSIS — F4310 Post-traumatic stress disorder, unspecified: Secondary | ICD-10-CM

## 2012-10-08 NOTE — Progress Notes (Signed)
  Subjective:    Patient ID: Tony Randolph, male    DOB: 01/01/1966, 47 y.o.   MRN: [id number]  Patient here today for CPE  Hypertension This is a chronic problem. The current episode started more than 1 year ago. The problem has been waxing and waning since onset. The problem is uncontrolled. Pertinent negatives include no blurred vision, chest pain, headaches, neck pain, palpitations, peripheral edema or shortness of breath. There are no associated agents to hypertension. Risk factors for coronary artery disease include dyslipidemia, obesity and male gender. Past treatments include nothing. The current treatment provides no improvement. There are no compliance problems.   Hyperlipidemia This is a chronic problem. The current episode started more than 1 year ago. The problem is uncontrolled. Recent lipid tests were reviewed and are high. Exacerbating diseases include obesity. Pertinent negatives include no chest pain or shortness of breath. He is currently on no antihyperlipidemic treatment. There are no compliance problems.  Risk factors for coronary artery disease include hypertension, male sex and obesity.  PTSD See kelly virgil - was on Zoloft for 20 years- ran out 2 weeks ago- They wanted to start him on paxil- He was recently put on tegretal and is doing quite well on that.- Has follow -up tomorrow.   Review of Systems  HENT: Negative for neck pain.   Eyes: Negative for blurred vision.  Respiratory: Negative for shortness of breath.   Cardiovascular: Negative for chest pain and palpitations.  Neurological: Negative for headaches.  All other systems reviewed and are negative.       Objective:   Physical Exam  Constitutional: He is oriented to person, place, and time. He appears well-developed and well-nourished.  HENT:  Head: Normocephalic.  Right Ear: External ear normal.  Left Ear: External ear normal.  Nose: Nose normal.  Mouth/Throat: Oropharynx is clear and moist.  Eyes:  EOM are normal. Pupils are equal, round, and reactive to light.  Neck: Normal range of motion. Neck supple. No thyromegaly present.  Cardiovascular: Normal rate, regular rhythm, normal heart sounds and intact distal pulses.   No murmur heard. Pulmonary/Chest: Effort normal and breath sounds normal. He has no wheezes. He has no rales.  Abdominal: Soft. Bowel sounds are normal.  Genitourinary:  Refused DRE  Musculoskeletal: Normal range of motion.  Neurological: He is alert and oriented to person, place, and time.  Skin: Skin is warm and dry.  Psychiatric: He has a normal mood and affect. His behavior is normal. Judgment and thought content normal.    BP 140/87  Pulse 76  Temp(Src) 98.1 F (36.7 C) (Oral)  Ht 5\' 9"  (1.753 m)  Wt 219 lb (99.338 kg)  BMI 32.33 kg/m2       Assessment & Plan:   1. Annual physical exam   2. PTSD (post-traumatic stress disorder)    Orders Placed This Encounter  Procedures  . COMPLETE METABOLIC PANEL WITH GFR  . NMR Lipoprofile with Lipids  . PSA  . Carbamazepine level, total    Fax results to [facility] [phone]   Continue tegretol as rx Talk with K.Virgil at appointment tomorrow about starting on Paxil Follow up in 3 months Encouraged patient to have prostate check but absolutely refuses- Has had BPH in the past  Mary-Margaret Daphine Deutscher, FNP

## 2012-10-08 NOTE — Patient Instructions (Signed)
Post-Traumatic Stress Disorder If you have been diagnosed with post-traumatic stress disorder (PTSD), you have probably experienced a traumatic event in your life. These events are usually outside of the range of normal human experience and would negatively impact any normal person.  CAUSES  A person can get PTSD after living through or seeing a dangerous event such as:  An automobile accident.  War.  Natural disaster.  Rape.  Domestic violence.  Any event where there has been a threat to life. PTSD is a real illness. PTSD Can happen to anyone at any age. Children get PTSD too. A doctor, or mental health professional with experience in treating PTSD can help you. SYMPTOMS  Not all symptoms may be present in any one person.  Distressing dreams.  Flashback: feeling the frightening event is happening again.  Avoiding activities, places, and people that remind you of the event.  Avoiding thoughts and feelings associated with the event.  Having frightening thoughts you cannot control.  Feeling on the edge with increased alertness and vigilance.  Trouble sleeping.  Feeling alone, detached from others.  Angry outbursts.  Feeling worried, guilty, or sad.  Having thoughts of hurting yourself or others. PTSD may start soon after a frightening event or months or years later. Many war veterans have PTSD. Drinking alcohol or using drugs will not help PTSD and may even make it worse.  TREATMENT  PTSD can be treated. Treatment may include "talk" therapy, medicine, or both. Either a doctor or a mental health professional who is experienced in treating PTSD can help you. Early diagnosis and treatment is best and can show more rapid improvement. Get help if you or a loved one are thinking of hurting yourself. Call your local emergency medical services if you need help immediately. Document Released: 01/04/2001 Document Revised: 07/04/2011 Document Reviewed: 12/19/2007 [employer] Patient  Information 2014 [employer].  

## 2012-10-09 LAB — LIPID PANEL
Cholesterol: 218 mg/dL — ABNORMAL HIGH (ref 0–200)
Triglycerides: 990 mg/dL — ABNORMAL HIGH (ref <150)

## 2012-10-09 LAB — COMPLETE METABOLIC PANEL WITH GFR
Alkaline Phosphatase: 103 U/L (ref 39–117)
BUN: 11 mg/dL (ref 6–23)
GFR, Est Non African American: >89 mL/min
Glucose, Bld: 371 mg/dL — ABNORMAL HIGH (ref 70–99)
Total Bilirubin: 0.8 mg/dL (ref 0.3–1.2)

## 2012-10-09 LAB — CARBAMAZEPINE LEVEL, TOTAL: Carbamazepine Lvl: 2.7 ug/mL — ABNORMAL LOW (ref 4.0–12.0)

## 2012-10-09 LAB — PSA: PSA: 0.36 ng/mL (ref <=4.00)

## 2012-10-11 NOTE — Addendum Note (Signed)
Addended by: Lisbeth Ply C on: 10/11/2012 11:59 AM   Modules accepted: Orders

## 2012-10-15 ENCOUNTER — Ambulatory Visit (INDEPENDENT_AMBULATORY_CARE_PROVIDER_SITE_OTHER): Payer: 59 | Admitting: Nurse Practitioner

## 2012-10-15 ENCOUNTER — Encounter: Payer: Self-pay | Admitting: Nurse Practitioner

## 2012-10-15 VITALS — BP 150/98 | HR 90 | Temp 98.6°F | Ht 69.0 in | Wt 217.0 lb

## 2012-10-15 DIAGNOSIS — E118 Type 2 diabetes mellitus with unspecified complications: Secondary | ICD-10-CM

## 2012-10-15 DIAGNOSIS — E119 Type 2 diabetes mellitus without complications: Secondary | ICD-10-CM | POA: Insufficient documentation

## 2012-10-15 DIAGNOSIS — F4310 Post-traumatic stress disorder, unspecified: Secondary | ICD-10-CM | POA: Insufficient documentation

## 2012-10-15 MED ORDER — METFORMIN HCL 1000 MG PO TABS: 1000.0000 mg | ORAL_TABLET | Freq: Two times a day (BID) | ORAL | Status: AC

## 2012-10-15 NOTE — Progress Notes (Signed)
  Subjective:    Patient ID: Tony Randolph, male    DOB: March 04, 1966, 47 y.o.   MRN: [id number]  HPI  Patient in today to discuss labs that were done at last visit- Blood sugar was elevated so HGBA1C was done and it was greater than 10%- Now diagnosed with diabetes.    Review of Systems  Constitutional: Positive for appetite change (increased).  Respiratory: Negative.   Cardiovascular: Negative.   Endocrine: Positive for polydipsia, polyphagia and polyuria.  Genitourinary: Positive for urgency.       Objective:   Physical Exam  Constitutional: He appears well-developed and well-nourished.  Cardiovascular: Normal rate and normal heart sounds.   Pulmonary/Chest: Effort normal and breath sounds normal.  Psychiatric: He has a normal mood and affect. His behavior is normal. Judgment and thought content normal.    BP 150/98  Pulse 90  Temp(Src) 98.6 F (37 C) (Oral)  Ht 5\' 9"  (1.753 m)  Wt 217 lb (98.431 kg)  BMI 32.03 kg/m2      Assessment & Plan:  Type II or unspecified type diabetes mellitus with unspecified complication, uncontrolled - Plan: metFORMIN (GLUCOPHAGE) 1000 MG tablet  Low carb diet- 45Gm of carbs 3X a day with meals and 15gm of carbs 2 snacks a day Look at food labels for carbs Soft drinks must be diet Blood sugar monitor given with directions Follow-up in 1 month with blood sugar diary  Mary-Margaret Daphine Deutscher, FNP

## 2012-10-15 NOTE — Patient Instructions (Signed)

## 2012-11-07 ENCOUNTER — Telehealth: Payer: Self-pay | Admitting: Nurse Practitioner

## 2012-11-07 MED ORDER — FREESTYLE LANCETS MISC: Status: AC

## 2012-11-07 MED ORDER — GLUCOSE BLOOD VI STRP: ORAL_STRIP | Status: AC

## 2012-11-07 NOTE — Telephone Encounter (Signed)
rx sent to [facility]

## 2012-11-07 NOTE — Telephone Encounter (Signed)
Free style insulinex

## 2012-11-07 NOTE — Telephone Encounter (Signed)
Patient notified

## 2012-12-03 ENCOUNTER — Ambulatory Visit: Payer: 59 | Admitting: Nurse Practitioner

## 2013-04-21 IMAGING — CR DG CHEST 2V
2 series · 2 of 2 positions shown · non-contrast
Comparison: 08/31/2010

CLINICAL DATA: Follow-up sarcoidosis.

CHEST - 2 VIEW

[view not recorded (1 of 2)]
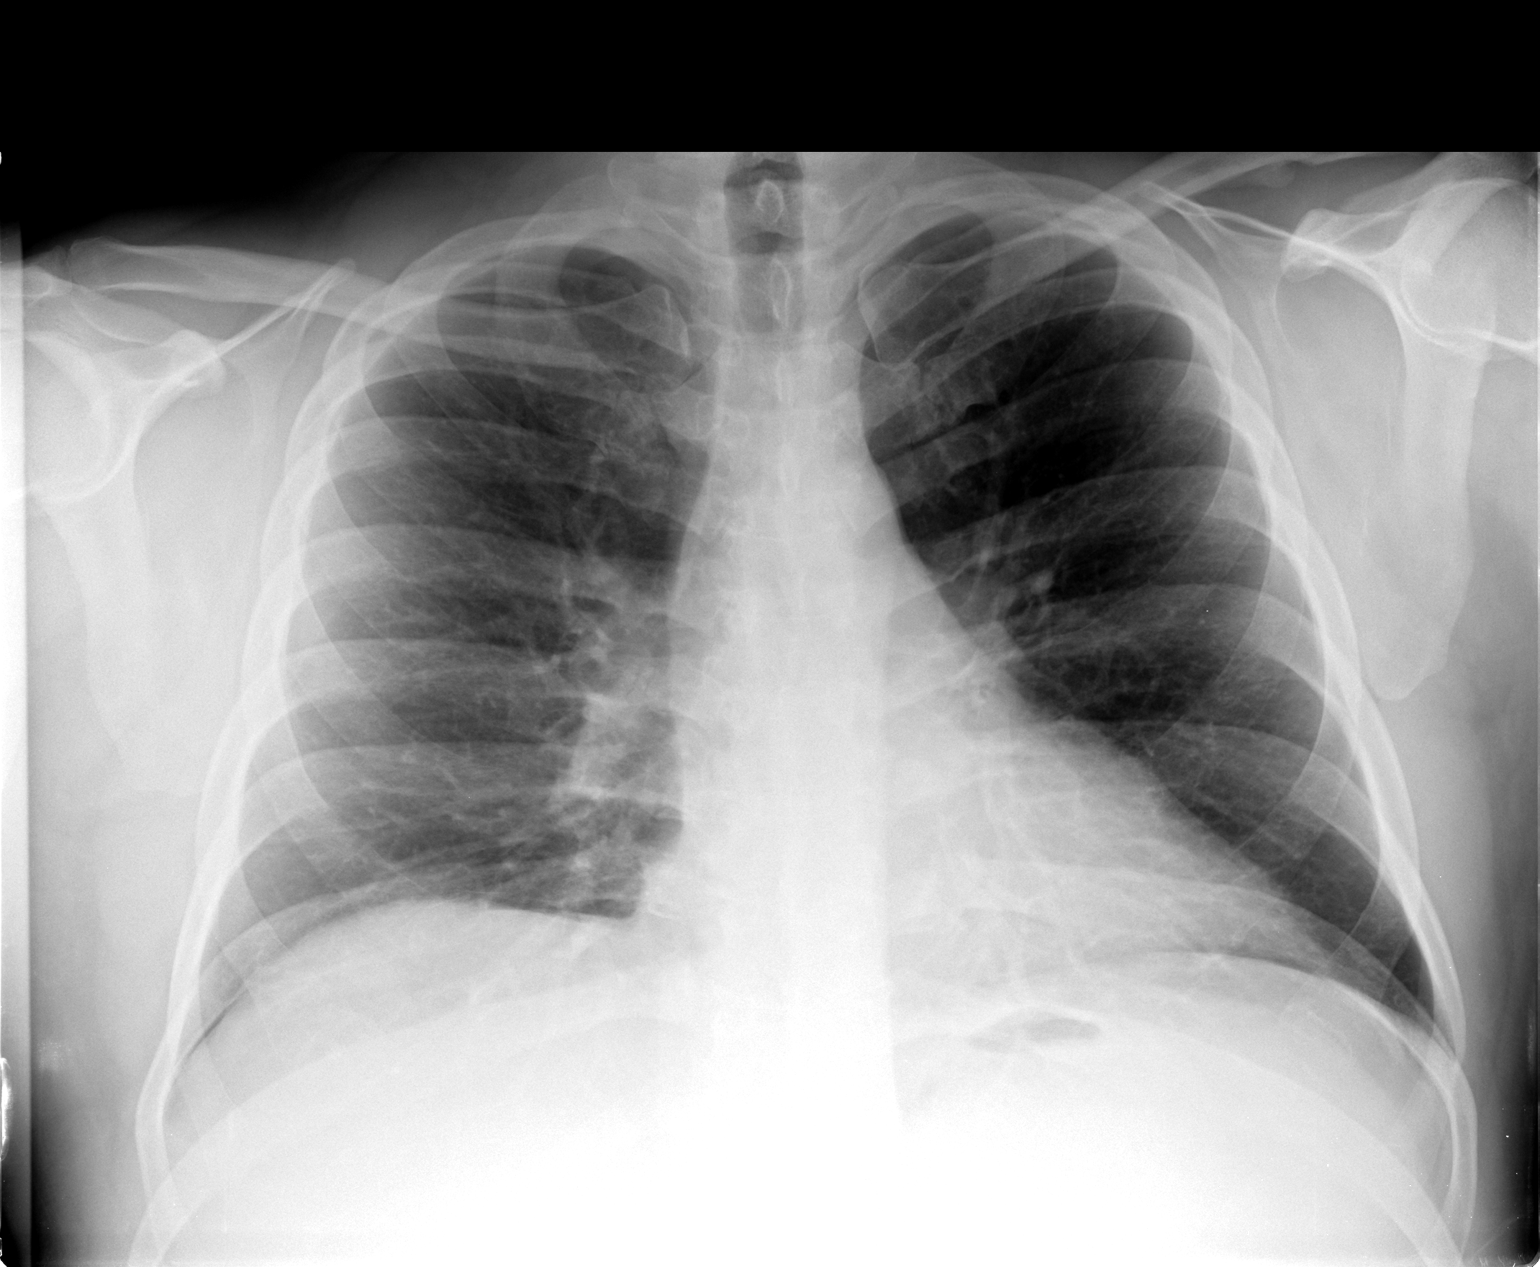

[view not recorded (2 of 2)]
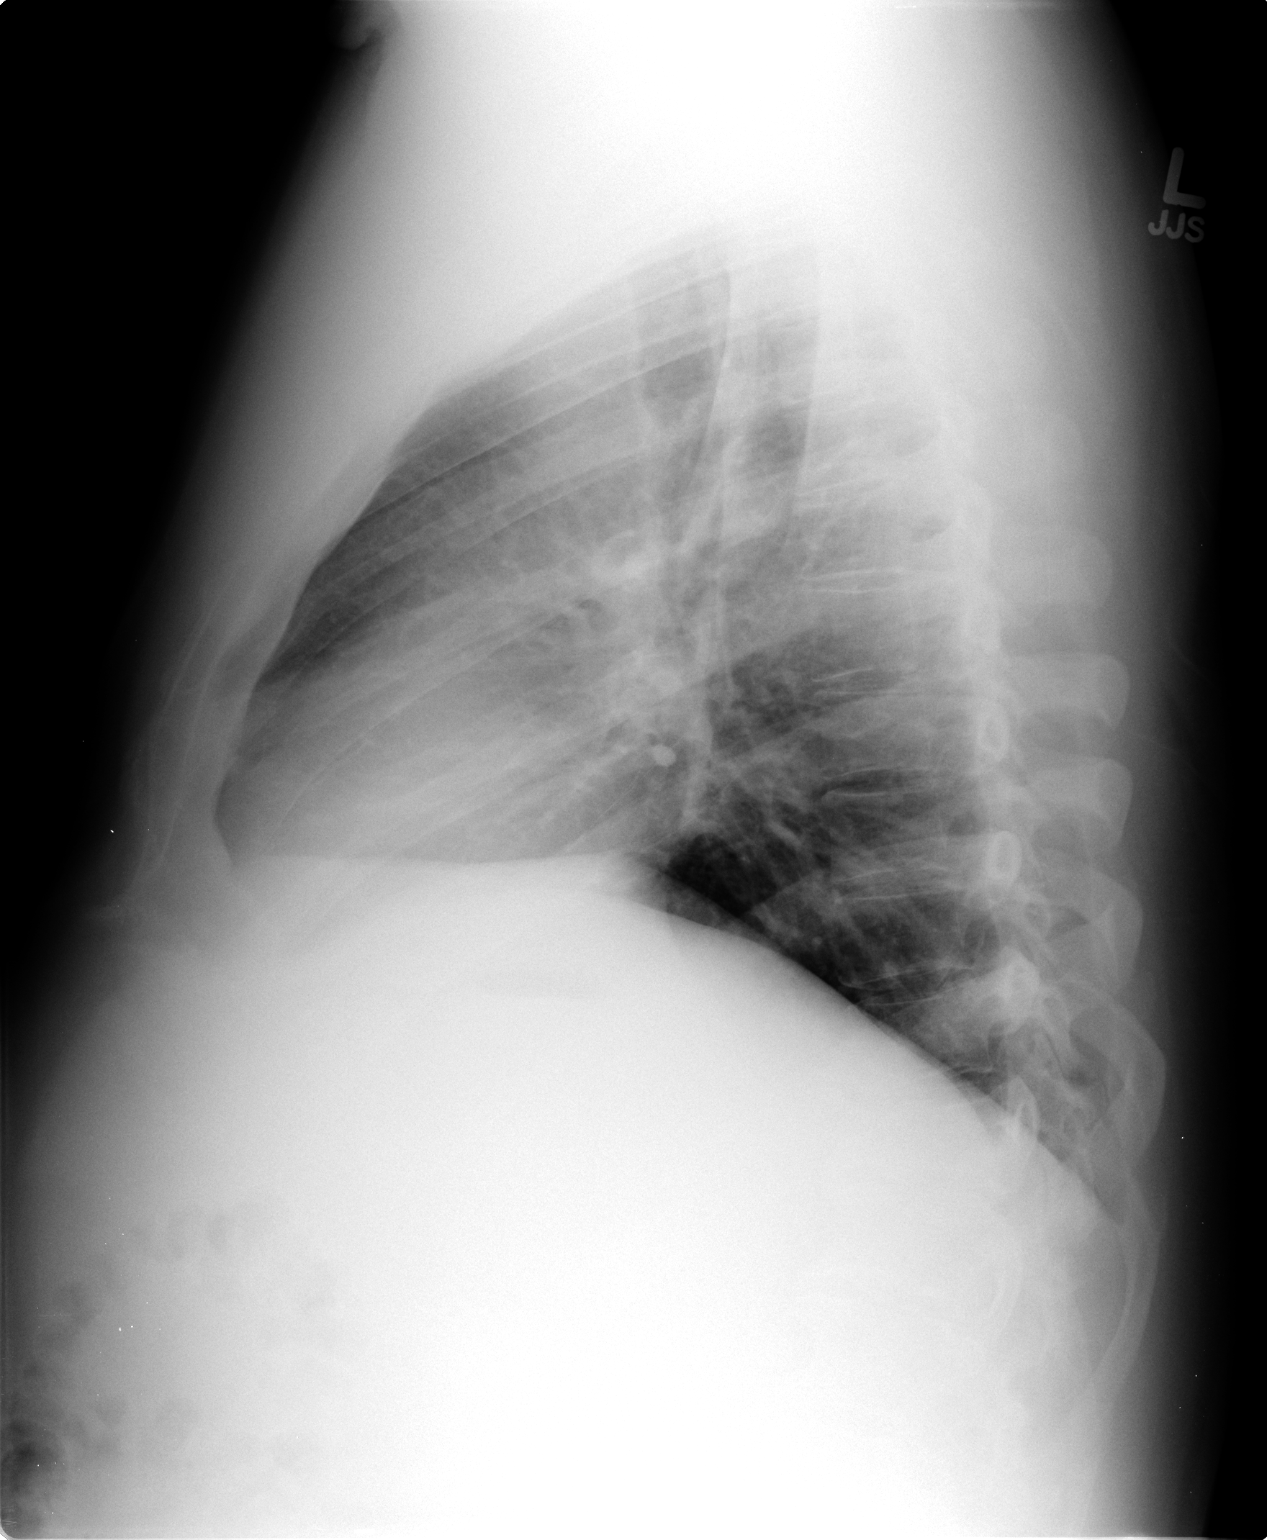

[2 of 2 positions shown; findings below may reference images not displayed]

FINDINGS: Heart and mediastinal contours are within normal limits.
No focal opacities or effusions.  No acute bony abnormality.
IMPRESSION: No active cardiopulmonary disease.

## 2018-08-16 ENCOUNTER — Encounter (HOSPITAL_BASED_OUTPATIENT_CLINIC_OR_DEPARTMENT_OTHER): Payer: Self-pay | Admitting: Physician Assistant

## 2018-08-16 DIAGNOSIS — S83232A Complex tear of medial meniscus, current injury, left knee, initial encounter: Secondary | ICD-10-CM

## 2018-08-16 HISTORY — DX: Complex tear of medial meniscus, current injury, left knee, initial encounter: S83.232A

## 2018-08-16 NOTE — H&P (Addendum)
Tony Randolph is an 53 y.o. male.   Chief Complaint: left knee complex medial meniscus tear HPI: Tony Randolph is a 53 year-old gentleman seen for an acute injury to his left knee that occurred with a twisting injury when he slipped at work on April 22, 2018.  He had significant pain in the knee.  Intermittent locking, catching and popping in the knee.  He was seen and has been treated since this injury at [facility] in [place].  He underwent an MRI on 07-23-28 which revealed a medial meniscus tear with a flipped fragment into the medial gutter with mild chondromalacia. He is  having locking, catching and popping of his knee with significant pain. 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 It is difficult for him to walk as well with locking and catching in the knee.  Past Medical History:  Diagnosis Date  . Complex tear of medial meniscus of left knee as current injury 08/16/2018  . Hyperlipidemia   . Hypertension   . Loeffler's syndrome (HCC)   . Seizures (HCC)     Past Surgical History:  Procedure Laterality Date  . COLON SURGERY  2005  . ELBOW SURGERY    . KNEE SURGERY      No family history on file. Social History:  reports that he has never smoked. He has never used smokeless tobacco. He reports current alcohol use. He reports that he does not use drugs.  Allergies: No Known Allergies  No medications prior to admission.    No results found for this or any previous visit (from the past 48 hour(s)). No results found.  Review of Systems  Constitutional: Negative.   HENT: Negative.   Eyes: Negative.   Respiratory: Negative.   Cardiovascular: Negative.   Gastrointestinal: Negative.   Genitourinary: Negative.   Musculoskeletal: Positive for joint pain.  Skin: Negative.   Neurological: Negative.   Endo/Heme/Allergies:  Negative.   Psychiatric/Behavioral: Negative.     Height 5\' 9"  (1.753 m), weight 94.3 kg. Physical Exam  Constitutional: He is oriented to person, place, and time. He appears well-developed and well-nourished.  HENT:  Head: Normocephalic and atraumatic.  Eyes: Pupils are equal, round, and reactive to light. Conjunctivae are normal.  Neck: Neck supple.  Cardiovascular: Normal rate.  Respiratory: Effort normal.  GI: Soft.  Genitourinary:    Genitourinary Comments: Not pertinent to current symptomatology therefore not examined.   Musculoskeletal:     Comments: Examination of the left knee reveals pain along the medial joint line. Positive medial McMurray's. There is 1+ effusion. He has limited range of motion from -10 to 90 degrees with severe pain. Examination of his right knee reveals a full range of motion without pain, swelling, weakness or instability. Vascular exam: pulses 2+ and symmetric.   Neurological: He is alert and oriented to person, place, and time.  Skin: Skin is warm and dry.  Psychiatric: He has a normal mood and affect. His behavior is normal.     Assessment Principal Problem:   Complex tear of medial meniscus of left knee as current injury Active Problems:   Sarcoidosis   HTN (hypertension)   Hyperlipidemia   GAD (generalized anxiety disorder)   PTSD (post-traumatic stress disorder)   Type II diabetes mellitus (HCC)   Plan At this point, his last Hemoglobin  A1C was 5.6. He was followed by [facility] in [place] for this. He is having no sarcoidosis issues and has not for many years. We will recommend with these findings and the fact this is an urgent procedure,  that it would cause him long term disability if the procedure was put off for 6-8 week and the fact he is having significant locking in the knee with pain, I recommend we proceed with a  left knee arthroscopy with partial meniscectomy and chondroplasty. The risks, complications and  benefits of surgery have been described to him in detail. He understands this completely. We will plan to set him up for this at some point in the near future.   Tabbatha Bordelon J Qunisha Bryk, PA-C 08/16/2018, 9:07 AM

## 2018-08-17 ENCOUNTER — Other Ambulatory Visit: Payer: Self-pay

## 2018-08-17 ENCOUNTER — Encounter (HOSPITAL_BASED_OUTPATIENT_CLINIC_OR_DEPARTMENT_OTHER): Payer: Self-pay | Admitting: *Deleted

## 2018-08-21 NOTE — Progress Notes (Signed)
Ensure pre surgical drink given to pt with instructions to finish by 0445 DOS. Pt verbalized understanding. 

## 2018-08-22 ENCOUNTER — Ambulatory Visit (HOSPITAL_BASED_OUTPATIENT_CLINIC_OR_DEPARTMENT_OTHER): Payer: No Typology Code available for payment source | Admitting: Anesthesiology

## 2018-08-22 ENCOUNTER — Encounter (HOSPITAL_BASED_OUTPATIENT_CLINIC_OR_DEPARTMENT_OTHER): Payer: Self-pay

## 2018-08-22 ENCOUNTER — Encounter (HOSPITAL_BASED_OUTPATIENT_CLINIC_OR_DEPARTMENT_OTHER)
Admission: RE | Disposition: A | Discharge status: Home / Self Care | Payer: Self-pay | Source: Home / Self Care | Attending: Orthopedic Surgery

## 2018-08-22 ENCOUNTER — Ambulatory Visit (HOSPITAL_BASED_OUTPATIENT_CLINIC_OR_DEPARTMENT_OTHER)
Admission: RE | Admit: 2018-08-22 | Discharge: 2018-08-22 | Disposition: A | Discharge status: Home / Self Care | Payer: No Typology Code available for payment source | Attending: Orthopedic Surgery | Admitting: Orthopedic Surgery

## 2018-08-22 DIAGNOSIS — D869 Sarcoidosis, unspecified: Secondary | ICD-10-CM | POA: Diagnosis present

## 2018-08-22 DIAGNOSIS — Y929 Unspecified place or not applicable: Secondary | ICD-10-CM | POA: Insufficient documentation

## 2018-08-22 DIAGNOSIS — S83232A Complex tear of medial meniscus, current injury, left knee, initial encounter: Secondary | ICD-10-CM | POA: Diagnosis not present

## 2018-08-22 DIAGNOSIS — S83272A Complex tear of lateral meniscus, current injury, left knee, initial encounter: Secondary | ICD-10-CM | POA: Diagnosis not present

## 2018-08-22 DIAGNOSIS — Z79899 Other long term (current) drug therapy: Secondary | ICD-10-CM | POA: Diagnosis not present

## 2018-08-22 DIAGNOSIS — I10 Essential (primary) hypertension: Secondary | ICD-10-CM | POA: Diagnosis present

## 2018-08-22 DIAGNOSIS — Y990 Civilian activity done for income or pay: Secondary | ICD-10-CM | POA: Diagnosis not present

## 2018-08-22 DIAGNOSIS — E785 Hyperlipidemia, unspecified: Secondary | ICD-10-CM | POA: Diagnosis not present

## 2018-08-22 DIAGNOSIS — E119 Type 2 diabetes mellitus without complications: Secondary | ICD-10-CM

## 2018-08-22 DIAGNOSIS — Z7984 Long term (current) use of oral hypoglycemic drugs: Secondary | ICD-10-CM | POA: Insufficient documentation

## 2018-08-22 DIAGNOSIS — Z7951 Long term (current) use of inhaled steroids: Secondary | ICD-10-CM | POA: Diagnosis not present

## 2018-08-22 DIAGNOSIS — F418 Other specified anxiety disorders: Secondary | ICD-10-CM | POA: Diagnosis not present

## 2018-08-22 DIAGNOSIS — F4310 Post-traumatic stress disorder, unspecified: Secondary | ICD-10-CM | POA: Diagnosis present

## 2018-08-22 DIAGNOSIS — F411 Generalized anxiety disorder: Secondary | ICD-10-CM | POA: Diagnosis present

## 2018-08-22 DIAGNOSIS — X501XXA Overexertion from prolonged static or awkward postures, initial encounter: Secondary | ICD-10-CM | POA: Insufficient documentation

## 2018-08-22 DIAGNOSIS — S83242A Other tear of medial meniscus, current injury, left knee, initial encounter: Secondary | ICD-10-CM | POA: Diagnosis present

## 2018-08-22 DIAGNOSIS — G4730 Sleep apnea, unspecified: Secondary | ICD-10-CM | POA: Diagnosis not present

## 2018-08-22 HISTORY — DX: Sarcoidosis, unspecified: D86.9

## 2018-08-22 HISTORY — PX: KNEE ARTHROSCOPY WITH MEDIAL MENISECTOMY: SHX5651

## 2018-08-22 HISTORY — DX: Type 2 diabetes mellitus without complications: E11.9

## 2018-08-22 HISTORY — DX: Male erectile dysfunction, unspecified: N52.9

## 2018-08-22 HISTORY — DX: Depression, unspecified: F32.A

## 2018-08-22 HISTORY — DX: Post-traumatic stress disorder, unspecified: F43.10

## 2018-08-22 HISTORY — DX: Complex tear of medial meniscus, current injury, left knee, initial encounter: S83.232A

## 2018-08-22 HISTORY — DX: Anxiety disorder, unspecified: F41.9

## 2018-08-22 HISTORY — DX: Sleep apnea, unspecified: G47.30

## 2018-08-22 HISTORY — DX: Major depressive disorder, single episode, unspecified: F32.9

## 2018-08-22 LAB — GLUCOSE, CAPILLARY
Glucose-Capillary: 108 mg/dL — ABNORMAL HIGH (ref 70–99)
Glucose-Capillary: 71 mg/dL (ref 70–99)

## 2018-08-22 SURGERY — ARTHROSCOPY, KNEE, WITH MEDIAL MENISCECTOMY
Anesthesia: General | Site: Knee | Laterality: Left

## 2018-08-22 MED ORDER — ARTIFICIAL TEARS OPHTHALMIC OINT
TOPICAL_OINTMENT | OPHTHALMIC | Status: AC
  Filled 2018-08-22: qty 3.5

## 2018-08-22 MED ORDER — OXYCODONE HCL 5 MG/5ML PO SOLN: 5.0000 mg | Freq: Once | ORAL | Status: DC | PRN

## 2018-08-22 MED ORDER — ACETAMINOPHEN 160 MG/5ML PO SOLN: 325.0000 mg | ORAL | Status: DC | PRN

## 2018-08-22 MED ORDER — BUPIVACAINE-EPINEPHRINE 0.25% -1:200000 IJ SOLN
INTRAMUSCULAR | Status: DC | PRN
  Administered 2018-08-22: 20 mL
  Administered 2018-08-22: 10 mL

## 2018-08-22 MED ORDER — DEXAMETHASONE SODIUM PHOSPHATE 10 MG/ML IJ SOLN
INTRAMUSCULAR | Status: AC
  Filled 2018-08-22: qty 1

## 2018-08-22 MED ORDER — LIDOCAINE HCL (PF) 1 % IJ SOLN
INTRAMUSCULAR | Status: AC
  Filled 2018-08-22: qty 60

## 2018-08-22 MED ORDER — MIDAZOLAM HCL 2 MG/2ML IJ SOLN
INTRAMUSCULAR | Status: AC
  Filled 2018-08-22: qty 2

## 2018-08-22 MED ORDER — KETOROLAC TROMETHAMINE 30 MG/ML IJ SOLN
INTRAMUSCULAR | Status: AC
  Filled 2018-08-22: qty 1

## 2018-08-22 MED ORDER — MEPERIDINE HCL 25 MG/ML IJ SOLN: 6.2500 mg | INTRAMUSCULAR | Status: DC | PRN

## 2018-08-22 MED ORDER — ONDANSETRON HCL 4 MG/2ML IJ SOLN
INTRAMUSCULAR | Status: AC
  Filled 2018-08-22: qty 2

## 2018-08-22 MED ORDER — ONDANSETRON HCL 4 MG/2ML IJ SOLN
4.0000 mg | Freq: Once | INTRAMUSCULAR | Status: DC | PRN
Start: 2018-08-22 — End: 2018-08-22

## 2018-08-22 MED ORDER — SODIUM CHLORIDE 0.9 % IR SOLN
Status: DC | PRN
  Administered 2018-08-22: 08:00:00 2 mL

## 2018-08-22 MED ORDER — CHLORHEXIDINE GLUCONATE 4 % EX LIQD: 60.0000 mL | Freq: Once | CUTANEOUS | Status: DC

## 2018-08-22 MED ORDER — EPINEPHRINE PF 1 MG/ML IJ SOLN
INTRAMUSCULAR | Status: AC
  Filled 2018-08-22: qty 2

## 2018-08-22 MED ORDER — ONDANSETRON HCL 4 MG/2ML IJ SOLN
INTRAMUSCULAR | Status: DC | PRN
  Administered 2018-08-22: 4 mg via INTRAVENOUS

## 2018-08-22 MED ORDER — LACTATED RINGERS IV SOLN
INTRAVENOUS | Status: DC
  Administered 2018-08-22: 07:00:00 via INTRAVENOUS

## 2018-08-22 MED ORDER — LIDOCAINE 2% (20 MG/ML) 5 ML SYRINGE
INTRAMUSCULAR | Status: DC | PRN
  Administered 2018-08-22: 60 mg via INTRAVENOUS

## 2018-08-22 MED ORDER — PROPOFOL 10 MG/ML IV BOLUS
INTRAVENOUS | Status: AC
  Filled 2018-08-22: qty 40

## 2018-08-22 MED ORDER — BUPIVACAINE HCL (PF) 0.5 % IJ SOLN
INTRAMUSCULAR | Status: AC
  Filled 2018-08-22: qty 30

## 2018-08-22 MED ORDER — MIDAZOLAM HCL 2 MG/2ML IJ SOLN
1.0000 mg | INTRAMUSCULAR | Status: DC | PRN
  Administered 2018-08-22: 07:00:00 2 mg via INTRAVENOUS

## 2018-08-22 MED ORDER — DEXAMETHASONE SODIUM PHOSPHATE 10 MG/ML IJ SOLN
INTRAMUSCULAR | Status: DC | PRN
  Administered 2018-08-22: 10 mg via INTRAVENOUS

## 2018-08-22 MED ORDER — PROPOFOL 10 MG/ML IV BOLUS
INTRAVENOUS | Status: DC | PRN
  Administered 2018-08-22: 200 mg via INTRAVENOUS
  Administered 2018-08-22: 50 mg via INTRAVENOUS

## 2018-08-22 MED ORDER — FENTANYL CITRATE (PF) 100 MCG/2ML IJ SOLN
50.0000 ug | INTRAMUSCULAR | Status: DC | PRN
  Administered 2018-08-22 (×2): 50 ug via INTRAVENOUS

## 2018-08-22 MED ORDER — FENTANYL CITRATE (PF) 100 MCG/2ML IJ SOLN
25.0000 ug | INTRAMUSCULAR | Status: DC | PRN
Start: 2018-08-22 — End: 2018-08-22

## 2018-08-22 MED ORDER — CEFAZOLIN SODIUM-DEXTROSE 2-4 GM/100ML-% IV SOLN
INTRAVENOUS | Status: AC
  Filled 2018-08-22: qty 100

## 2018-08-22 MED ORDER — HYDROCODONE-ACETAMINOPHEN 5-325 MG PO TABS: 1.0000 | ORAL_TABLET | ORAL | 0 refills | Status: AC | PRN

## 2018-08-22 MED ORDER — BUPIVACAINE HCL (PF) 0.25 % IJ SOLN
INTRAMUSCULAR | Status: AC
  Filled 2018-08-22: qty 120

## 2018-08-22 MED ORDER — LACTATED RINGERS IV SOLN: INTRAVENOUS | Status: DC

## 2018-08-22 MED ORDER — LIDOCAINE 2% (20 MG/ML) 5 ML SYRINGE
INTRAMUSCULAR | Status: AC
  Filled 2018-08-22: qty 5

## 2018-08-22 MED ORDER — SCOPOLAMINE 1 MG/3DAYS TD PT72: 1.0000 | MEDICATED_PATCH | Freq: Once | TRANSDERMAL | Status: DC | PRN

## 2018-08-22 MED ORDER — OXYCODONE HCL 5 MG PO TABS: 5.0000 mg | ORAL_TABLET | Freq: Once | ORAL | Status: DC | PRN

## 2018-08-22 MED ORDER — BUPIVACAINE-EPINEPHRINE (PF) 0.25% -1:200000 IJ SOLN
INTRAMUSCULAR | Status: AC
  Filled 2018-08-22: qty 30

## 2018-08-22 MED ORDER — FENTANYL CITRATE (PF) 100 MCG/2ML IJ SOLN
INTRAMUSCULAR | Status: AC
  Filled 2018-08-22: qty 2

## 2018-08-22 MED ORDER — ACETAMINOPHEN 325 MG PO TABS: 325.0000 mg | ORAL_TABLET | ORAL | Status: DC | PRN

## 2018-08-22 MED ORDER — CEFAZOLIN SODIUM-DEXTROSE 2-4 GM/100ML-% IV SOLN
2.0000 g | INTRAVENOUS | Status: AC
  Administered 2018-08-22: 2 g via INTRAVENOUS

## 2018-08-22 SURGICAL SUPPLY — 38 items
BANDAGE ACE 6X5 VEL STRL LF (GAUZE/BANDAGES/DRESSINGS) ×3 IMPLANT
BNDG COHESIVE 4X5 TAN STRL (GAUZE/BANDAGES/DRESSINGS) ×2 IMPLANT
COVER WAND RF STERILE (DRAPES) IMPLANT
DISSECTOR  3.8MM X 13CM (MISCELLANEOUS)
DISSECTOR 3.8MM X 13CM (MISCELLANEOUS) IMPLANT
DRAPE ARTHROSCOPY W/POUCH 90 (DRAPES) ×3 IMPLANT
DURAPREP 26ML APPLICATOR (WOUND CARE) ×3 IMPLANT
EXCALIBUR 3.8MM X 13CM (MISCELLANEOUS) ×2 IMPLANT
GAUZE SPONGE 4X4 12PLY STRL (GAUZE/BANDAGES/DRESSINGS) ×3 IMPLANT
GAUZE XEROFORM 1X8 LF (GAUZE/BANDAGES/DRESSINGS) ×3 IMPLANT
GLOVE BIO SURGEON STRL SZ7 (GLOVE) ×3 IMPLANT
GLOVE BIOGEL PI IND STRL 7.0 (GLOVE) ×1 IMPLANT
GLOVE BIOGEL PI IND STRL 7.5 (GLOVE) ×1 IMPLANT
GLOVE BIOGEL PI INDICATOR 7.0 (GLOVE) ×2
GLOVE BIOGEL PI INDICATOR 7.5 (GLOVE) ×2
GLOVE SS BIOGEL STRL SZ 7.5 (GLOVE) ×1 IMPLANT
GLOVE SUPERSENSE BIOGEL SZ 7.5 (GLOVE) ×2
GOWN STRL REUS W/ TWL LRG LVL3 (GOWN DISPOSABLE) ×2 IMPLANT
GOWN STRL REUS W/ TWL XL LVL3 (GOWN DISPOSABLE) ×1 IMPLANT
GOWN STRL REUS W/TWL LRG LVL3 (GOWN DISPOSABLE) ×6
GOWN STRL REUS W/TWL XL LVL3 (GOWN DISPOSABLE) ×3
KNEE WRAP E Z 3 GEL PACK (MISCELLANEOUS) ×3 IMPLANT
MANIFOLD NEPTUNE II (INSTRUMENTS) IMPLANT
NDL SAFETY ECLIPSE 18X1.5 (NEEDLE) ×2 IMPLANT
NEEDLE HYPO 18GX1.5 SHARP (NEEDLE) ×6
NEEDLE HYPO 22GX1.5 SAFETY (NEEDLE) ×3 IMPLANT
PACK ARTHROSCOPY DSU (CUSTOM PROCEDURE TRAY) ×3 IMPLANT
PACK BASIN DAY SURGERY FS (CUSTOM PROCEDURE TRAY) ×3 IMPLANT
PAD ALCOHOL SWAB (MISCELLANEOUS) IMPLANT
PENCIL BUTTON HOLSTER BLD 10FT (ELECTRODE) ×3 IMPLANT
PORT APPOLLO RF 90DEGREE MULTI (SURGICAL WAND) IMPLANT
SUT ETHILON 4 0 PS 2 18 (SUTURE) ×2 IMPLANT
SUT PROLENE 3 0 PS 2 (SUTURE) IMPLANT
SYR 20CC LL (SYRINGE) ×2 IMPLANT
SYR 5ML LL (SYRINGE) ×3 IMPLANT
TOWEL GREEN STERILE FF (TOWEL DISPOSABLE) ×3 IMPLANT
TUBING ARTHROSCOPY IRRIG 16FT (MISCELLANEOUS) ×3 IMPLANT
WATER STERILE IRR 1000ML POUR (IV SOLUTION) ×3 IMPLANT

## 2018-08-22 NOTE — Anesthesia Preprocedure Evaluation (Addendum)
Anesthesia Evaluation  Patient identified by MRN, date of birth, ID band Patient awake    Reviewed: Allergy & Precautions, H&P , NPO status , Patient's Chart, lab work & pertinent test results, reviewed documented beta blocker date and time   Airway Mallampati: II  TM Distance: >3 FB Neck ROM: full    Dental no notable dental hx. (+) Teeth Intact, Dental Advisory Given,    Pulmonary sleep apnea ,    Pulmonary exam normal breath sounds clear to auscultation       Cardiovascular Exercise Tolerance: Good hypertension, Pt. on medications  Rhythm:regular Rate:Normal     Neuro/Psych PSYCHIATRIC DISORDERS Anxiety Depression negative neurological ROS     GI/Hepatic negative GI ROS, Neg liver ROS,   Endo/Other  diabetes, Type 2  Renal/GU negative Renal ROS  negative genitourinary   Musculoskeletal   Abdominal   Peds  Hematology negative hematology ROS (+)   Anesthesia Other Findings Sarcoidosis HTN  Hyperlipidemia GAD (generalized anxiety disorder) Hypogonadism male PTSD  Type II diabetes mellitus (HCC)    Reproductive/Obstetrics negative OB ROS                            Anesthesia Physical Anesthesia Plan  ASA: III  Anesthesia Plan: General   Post-op Pain Management:    Induction: Intravenous  PONV Risk Score and Plan: 2 and Treatment may vary due to age or medical condition and Ondansetron  Airway Management Planned: Oral ETT and LMA  Additional Equipment:   Intra-op Plan:   Post-operative Plan: Extubation in OR  Informed Consent: I have reviewed the patients History and Physical, chart, labs and discussed the procedure including the risks, benefits and alternatives for the proposed anesthesia with the patient or authorized representative who has indicated his/her understanding and acceptance.     Dental Advisory Given  Plan Discussed with: CRNA, Anesthesiologist and  Surgeon  Anesthesia Plan Comments: (  )        Anesthesia Quick Evaluation

## 2018-08-22 NOTE — Interval H&P Note (Signed)
History and Physical Interval Note:  08/22/2018 6:49 AM  Tony Randolph  has presented today for surgery, with the diagnosis of LEFT KNEE MENISCUS TEAR, BUCKET HANDLE TEAR, LOCKED KNEE.  The various methods of treatment have been discussed with the patient and family. After consideration of risks, benefits and other options for treatment, the patient has consented to  Procedure(s): KNEE ARTHROSCOPY WITH MEDIAL MENISECTOMY VS MENISCAL REPAIR (Left) as a surgical intervention.  The patient's history has been reviewed, patient examined, no change in status, stable for surgery.  I have reviewed the patient's chart and labs.  Questions were answered to the patient's satisfaction.     Nilda Simmer

## 2018-08-22 NOTE — Transfer of Care (Signed)
Immediate Anesthesia Transfer of Care Note  Patient: Tony Randolph [facility]  Procedure(s) Performed: KNEE ARTHROSCOPY WITH MEDIAL MENISECTOMY VS MENISCAL REPAIR (Left Knee)  Patient Location: PACU  Anesthesia Type:General  Level of Consciousness: drowsy and patient cooperative  Airway & Oxygen Therapy: Patient Spontanous Breathing and Patient connected to nasal cannula oxygen  Post-op Assessment: Report given to RN and Post -op Vital signs reviewed and stable  Post vital signs: Reviewed and stable  Last Vitals:  Vitals Value Taken Time  BP    Temp    Pulse 76 08/22/2018  8:07 AM  Resp    SpO2 91 % 08/22/2018  8:07 AM  Vitals shown include unvalidated device data.  Last Pain:  Vitals:   08/22/18 0637  TempSrc: Oral  PainSc: 0-No pain      Patients Stated Pain Goal: 6 (08/22/18 9604)  Complications: No apparent anesthesia complications

## 2018-08-22 NOTE — Op Note (Signed)
NAME: Tony Randolph, Tony Randolph. MEDICAL RECORD [id number] ACCOUNT [id number] DATE OF BIRTH:01-05-1966 FACILITY: [facility] LOCATION: [facility] PHYSICIAN:Crit Obremski Salley Slaughter, MD  OPERATIVE REPORT  DATE OF PROCEDURE:  08/22/2018  PREOPERATIVE DIAGNOSES:   1.  Left knee acute traumatic medial and lateral meniscal tears. 2.  Left knee chondromalacia.  POSTOPERATIVE DIAGNOSES:   1.  Left knee acute traumatic medial and lateral meniscal tears. 2.  Left knee chondromalacia.  PROCEDURE:  1.  Left knee EUA followed by arthroscopic partial medial and lateral meniscectomies. 2.  Left knee chondroplasty.  SURGEON:  Salvatore Marvel, MD  ASSISTANT:  Genelle Bal, PA.  ANESTHESIA:  General.  OPERATIVE TIME:  30 minutes.  COMPLICATIONS:  None.  INDICATIONS:  The patient is a 53 year old gentleman who sustained a twisting injury to his left knee on 04/22/2018 at work.  Exam and MRI has revealed a medial and possible lateral meniscus tear.  He has failed conservative care with significant pain,  locking and catching in the knee and is now to undergo arthroscopy.  DESCRIPTION OF PROCEDURE:  The patient was brought to the operating room on 08/22/2018, placed on the operating table in supine position.  After being placed under general anesthesia, his left knee was examined.  He had full range of motion.  He was  stable to ligamentous exam with normal patellar tracking.  The left knee was sterilely injected with 0.25% Marcaine with epinephrine.  Left leg was prepped using sterile ChloraPrep and draped using sterile technique.  Time-out procedure was called and  the correct left knee identified initially through an anterolateral portal, the arthroscope with a pump attached was placed in through an anteromedial portal, and an arthroscopic probe was placed.  On initial inspection of medial compartment, the  articular cartilage showed 25%, grade III chondromalacia and a small area under the medial tibial  plateau, medial meniscus of grade IV changes 10%.  He had a flipped fragment displaced of the medial horn of the medial meniscus as well as a posterior horn  tear and 30-40% was resected back to a stable rim.  Intercondylar notch inspected.  The anterior cruciate ligament had mild laxity of 3 mm with an endpoint.  PCL was intact and stable.  Lateral compartment inspected.  He had 30%, grade III  chondromalacia, which was debrided.  Lateral meniscus showed a tear of the posterior horn, 20%, which was resected back to a stable rim.  Patellofemoral joint showed 30%, grade III chondromalacia, which was debrided.  The patella tracked normally.   Medial and lateral gutters were free of pathology.  At this point, it was felt that all pathology had been satisfactorily addressed.  The instruments were removed.  Portals closed with 3-0 nylon suture.  Knee injected with 0.25% Marcaine with  epinephrine.  Sterile dressings were applied and the patient awakened and taken to recovery room in stable condition.    FOLLOWUP CARE:   The patient will be followed as an outpatient on Norco for pain.  See me in the office in a week for sutures out and followup.  AN/NUANCE  D:08/22/2018 T:08/22/2018 JOB:[id number]

## 2018-08-22 NOTE — Anesthesia Procedure Notes (Signed)
Procedure Name: LMA Insertion Date/Time: 08/22/2018 7:28 AM Performed by: Tyrone Nine, CRNA Pre-anesthesia Checklist: Patient identified, Emergency Drugs available, Suction available, Patient being monitored and Timeout performed Patient Re-evaluated:Patient Re-evaluated prior to induction Oxygen Delivery Method: Circle system utilized Preoxygenation: Pre-oxygenation with 100% oxygen Induction Type: IV induction Ventilation: Mask ventilation without difficulty LMA: LMA inserted LMA Size: 5.0 Number of attempts: 1 Placement Confirmation: breath sounds checked- equal and bilateral,  positive ETCO2 and CO2 detector Tube secured with: Tape Dental Injury: Teeth and Oropharynx as per pre-operative assessment

## 2018-08-22 NOTE — Discharge Instructions (Signed)

## 2018-08-22 NOTE — Anesthesia Postprocedure Evaluation (Signed)
Anesthesia Post Note  Patient: Tony Randolph [facility]  Procedure(s) Performed: KNEE ARTHROSCOPY WITH MEDIAL MENISECTOMY VS MENISCAL REPAIR (Left Knee)     Patient location during evaluation: PACU Anesthesia Type: General Level of consciousness: awake and alert Pain management: pain level controlled Vital Signs Assessment: post-procedure vital signs reviewed and stable Respiratory status: spontaneous breathing, nonlabored ventilation, respiratory function stable and patient connected to nasal cannula oxygen Cardiovascular status: blood pressure returned to baseline and stable Postop Assessment: no apparent nausea or vomiting Anesthetic complications: no    Last Vitals:  Vitals:   08/22/18 0830 08/22/18 0848  BP: 122/73 119/79  Pulse: 72 78  Resp: 18 18  Temp:  37.1 C  SpO2: 97% 97%    Last Pain:  Vitals:   08/22/18 0848  TempSrc:   PainSc: 0-No pain                 Quita Mcgrory

## 2018-08-23 ENCOUNTER — Encounter (HOSPITAL_BASED_OUTPATIENT_CLINIC_OR_DEPARTMENT_OTHER): Payer: Self-pay | Admitting: Orthopedic Surgery

## 2018-08-23 NOTE — Addendum Note (Signed)
Addendum  created 08/23/18 1200 by Lance Coon, CRNA   Charge Capture section accepted, Visit diagnoses modified

## 2018-09-06 ENCOUNTER — Encounter (HOSPITAL_BASED_OUTPATIENT_CLINIC_OR_DEPARTMENT_OTHER): Payer: Self-pay | Admitting: Orthopedic Surgery

## 2023-05-28 LAB — COLOGUARD: COLOGUARD: NEGATIVE
